# Patient Record
Sex: Female | Born: 1975 | Race: Black or African American | Hispanic: No | Marital: Single | Smoking: Current every day smoker
Health system: Southern US, Community
[De-identification: ages and names within clinical notes are randomized; demographics above are authoritative.]

## PROBLEM LIST (undated history)

## (undated) ENCOUNTER — Inpatient Hospital Stay (HOSPITAL_COMMUNITY): Payer: Self-pay

## (undated) DIAGNOSIS — A599 Trichomoniasis, unspecified: Secondary | ICD-10-CM

## (undated) DIAGNOSIS — N938 Other specified abnormal uterine and vaginal bleeding: Secondary | ICD-10-CM

## (undated) DIAGNOSIS — N879 Dysplasia of cervix uteri, unspecified: Secondary | ICD-10-CM

## (undated) DIAGNOSIS — N719 Inflammatory disease of uterus, unspecified: Secondary | ICD-10-CM

## (undated) DIAGNOSIS — N12 Tubulo-interstitial nephritis, not specified as acute or chronic: Secondary | ICD-10-CM

## (undated) DIAGNOSIS — F1920 Other psychoactive substance dependence, uncomplicated: Secondary | ICD-10-CM

## (undated) HISTORY — DX: Tubulo-interstitial nephritis, not specified as acute or chronic: N12

## (undated) HISTORY — DX: Other specified abnormal uterine and vaginal bleeding: N93.8

## (undated) HISTORY — DX: Dysplasia of cervix uteri, unspecified: N87.9

## (undated) HISTORY — DX: Other psychoactive substance dependence, uncomplicated: F19.20

## (undated) HISTORY — DX: Inflammatory disease of uterus, unspecified: N71.9

## (undated) HISTORY — DX: Trichomoniasis, unspecified: A59.9

## (undated) HISTORY — PX: WISDOM TOOTH EXTRACTION: SHX21

---

## 1998-02-14 ENCOUNTER — Emergency Department (HOSPITAL_COMMUNITY): Admission: EM | Admit: 1998-02-14 | Discharge: 1998-02-14 | Payer: Self-pay | Admitting: *Deleted

## 1999-01-10 ENCOUNTER — Emergency Department (HOSPITAL_COMMUNITY): Admission: EM | Admit: 1999-01-10 | Discharge: 1999-01-10 | Payer: Self-pay | Admitting: Emergency Medicine

## 1999-06-16 ENCOUNTER — Emergency Department (HOSPITAL_COMMUNITY): Admission: EM | Admit: 1999-06-16 | Discharge: 1999-06-16 | Payer: Self-pay | Admitting: Emergency Medicine

## 2000-01-13 ENCOUNTER — Emergency Department (HOSPITAL_COMMUNITY): Admission: EM | Admit: 2000-01-13 | Discharge: 2000-01-13 | Payer: Self-pay | Admitting: Emergency Medicine

## 2000-04-03 ENCOUNTER — Inpatient Hospital Stay (HOSPITAL_COMMUNITY): Admission: AD | Admit: 2000-04-03 | Discharge: 2000-04-03 | Payer: Self-pay | Admitting: *Deleted

## 2000-05-27 ENCOUNTER — Inpatient Hospital Stay (HOSPITAL_COMMUNITY): Admission: AD | Admit: 2000-05-27 | Discharge: 2000-05-27 | Payer: Self-pay | Admitting: *Deleted

## 2001-02-24 ENCOUNTER — Inpatient Hospital Stay (HOSPITAL_COMMUNITY): Admission: AD | Admit: 2001-02-24 | Discharge: 2001-02-24 | Payer: Self-pay | Admitting: *Deleted

## 2001-03-02 ENCOUNTER — Inpatient Hospital Stay (HOSPITAL_COMMUNITY): Admission: AD | Admit: 2001-03-02 | Discharge: 2001-03-02 | Payer: Self-pay | Admitting: Obstetrics & Gynecology

## 2001-07-19 ENCOUNTER — Inpatient Hospital Stay (HOSPITAL_COMMUNITY): Admission: AD | Admit: 2001-07-19 | Discharge: 2001-07-19 | Payer: Self-pay | Admitting: *Deleted

## 2001-10-24 ENCOUNTER — Inpatient Hospital Stay (HOSPITAL_COMMUNITY): Admission: AD | Admit: 2001-10-24 | Discharge: 2001-10-24 | Payer: Self-pay | Admitting: *Deleted

## 2002-05-24 ENCOUNTER — Encounter: Payer: Self-pay | Admitting: Emergency Medicine

## 2002-05-24 ENCOUNTER — Emergency Department (HOSPITAL_COMMUNITY): Admission: EM | Admit: 2002-05-24 | Discharge: 2002-05-24 | Payer: Self-pay | Admitting: Emergency Medicine

## 2002-06-20 ENCOUNTER — Inpatient Hospital Stay (HOSPITAL_COMMUNITY): Admission: AD | Admit: 2002-06-20 | Discharge: 2002-06-20 | Payer: Self-pay | Admitting: Obstetrics and Gynecology

## 2002-06-20 ENCOUNTER — Encounter: Payer: Self-pay | Admitting: Obstetrics and Gynecology

## 2002-07-04 ENCOUNTER — Inpatient Hospital Stay (HOSPITAL_COMMUNITY): Admission: AD | Admit: 2002-07-04 | Discharge: 2002-07-04 | Payer: Self-pay | Admitting: Obstetrics and Gynecology

## 2003-06-17 ENCOUNTER — Emergency Department (HOSPITAL_COMMUNITY): Admission: EM | Admit: 2003-06-17 | Discharge: 2003-06-18 | Payer: Self-pay | Admitting: Emergency Medicine

## 2004-08-06 ENCOUNTER — Emergency Department (HOSPITAL_COMMUNITY): Admission: EM | Admit: 2004-08-06 | Discharge: 2004-08-06 | Payer: Self-pay | Admitting: Emergency Medicine

## 2004-08-07 ENCOUNTER — Emergency Department (HOSPITAL_COMMUNITY): Admission: EM | Admit: 2004-08-07 | Discharge: 2004-08-07 | Payer: Self-pay | Admitting: Emergency Medicine

## 2004-11-12 ENCOUNTER — Inpatient Hospital Stay (HOSPITAL_COMMUNITY): Admission: AD | Admit: 2004-11-12 | Discharge: 2004-11-12 | Payer: Self-pay | Admitting: Obstetrics & Gynecology

## 2004-11-24 ENCOUNTER — Encounter (INDEPENDENT_AMBULATORY_CARE_PROVIDER_SITE_OTHER): Payer: Self-pay | Admitting: *Deleted

## 2004-11-24 ENCOUNTER — Ambulatory Visit: Payer: Self-pay | Admitting: *Deleted

## 2005-03-02 ENCOUNTER — Ambulatory Visit: Payer: Self-pay | Admitting: *Deleted

## 2005-05-06 ENCOUNTER — Inpatient Hospital Stay (HOSPITAL_COMMUNITY): Admission: AD | Admit: 2005-05-06 | Discharge: 2005-05-06 | Payer: Self-pay | Admitting: *Deleted

## 2005-05-31 HISTORY — PX: DILATION AND CURETTAGE OF UTERUS: SHX78

## 2005-06-22 ENCOUNTER — Emergency Department (HOSPITAL_COMMUNITY): Admission: EM | Admit: 2005-06-22 | Discharge: 2005-06-22 | Payer: Self-pay | Admitting: Emergency Medicine

## 2005-06-24 ENCOUNTER — Ambulatory Visit: Payer: Self-pay | Admitting: Family Medicine

## 2005-12-29 ENCOUNTER — Emergency Department (HOSPITAL_COMMUNITY): Admission: EM | Admit: 2005-12-29 | Discharge: 2005-12-29 | Payer: Self-pay | Admitting: Family Medicine

## 2005-12-29 ENCOUNTER — Emergency Department (HOSPITAL_COMMUNITY): Admission: EM | Admit: 2005-12-29 | Discharge: 2005-12-29 | Payer: Self-pay | Admitting: Emergency Medicine

## 2005-12-29 ENCOUNTER — Emergency Department (HOSPITAL_COMMUNITY): Admission: EM | Admit: 2005-12-29 | Discharge: 2005-12-30 | Payer: Self-pay | Admitting: Emergency Medicine

## 2006-04-25 ENCOUNTER — Emergency Department (HOSPITAL_COMMUNITY): Admission: EM | Admit: 2006-04-25 | Discharge: 2006-04-25 | Payer: Self-pay | Admitting: Emergency Medicine

## 2006-06-23 IMAGING — US US TRANSVAGINAL NON-OB
1 series · 18 of 25 positions shown · non-contrast
Comparison: none

CLINICAL DATA: Abdominal pain.  LMP 10/07/04.  Vaginal bleeding.
TRANSABDOMINAL AND ENDOVAGINAL PELVIC ULTRASOUND:
TECHNIQUE: Multiple images of the uterus and adnexa were obtained using a transabdominal and endovaginal approaches. 
The uterus has a sagittal length of 8.0 cm, AP width of 4.5 cm and a transverse width of 5.1 cm.  A homogeneous uterine myometrium is seen.  The endometrial canal demonstrates an area of questionable focal thickening in the upper uterine segment portion of the canal.  Color Doppler evaluation of this area which measures .6 cm in AP width demonstrates some internal flow and could represent a focal polyp.  As the patient is in a presecretory phase of the cycle, initial reassessment in the post-secretory phase of the cycle would be recommended to see if this finding persists after menses.  If it does, sonohysterography would be recommended for complete evaluation.  
Both ovaries are seen and have a normal appearance with the right ovary measuring 3.0 x 1.9 x 2.1 cm and the left ovary measuring 3.1 x 1.4 x 2.4 cm. A small simple left paraovarian cyst is identified measuring 1.1 x 0.7 x 0.9 cm.  No cul-de-sac or periovarian fluid is seen and no separate adnexal masses are otherwise noted.

[Series 1: us transvaginal non-ob · 18 of 49 slices shown]
[im 1/49]
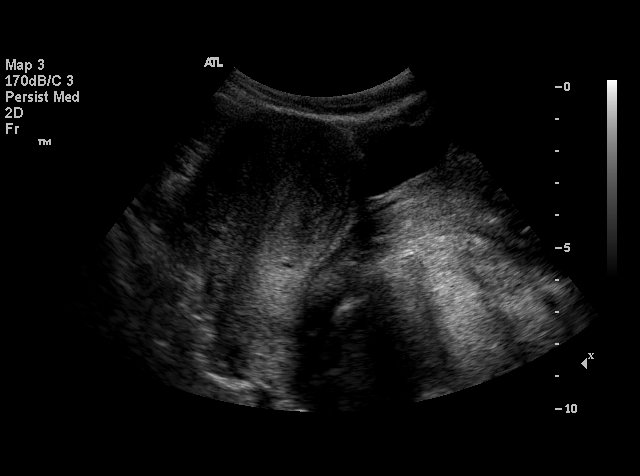
[im 5/49]
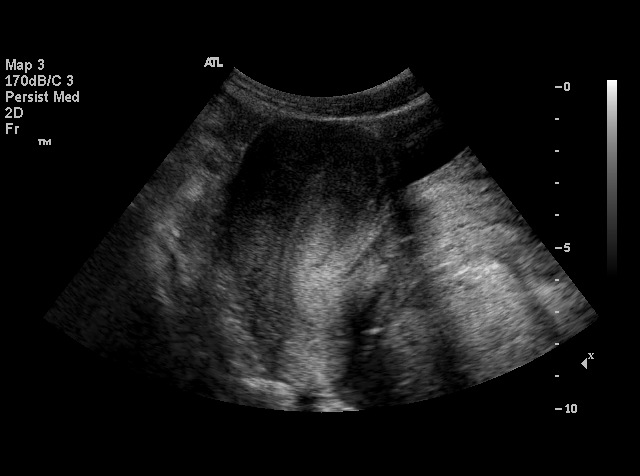
[im 7/49]
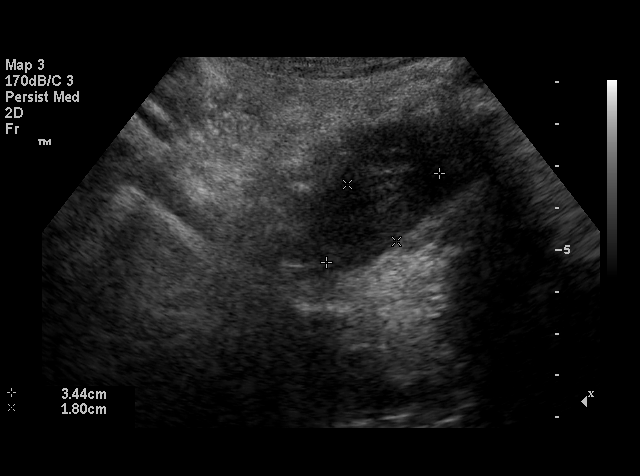
[im 9/49]
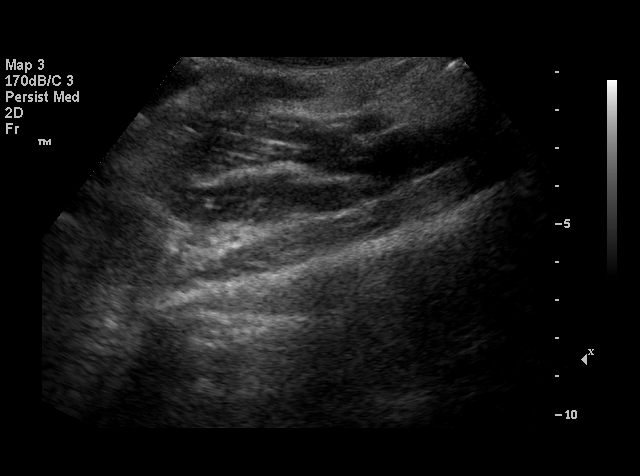
[im 13/49]
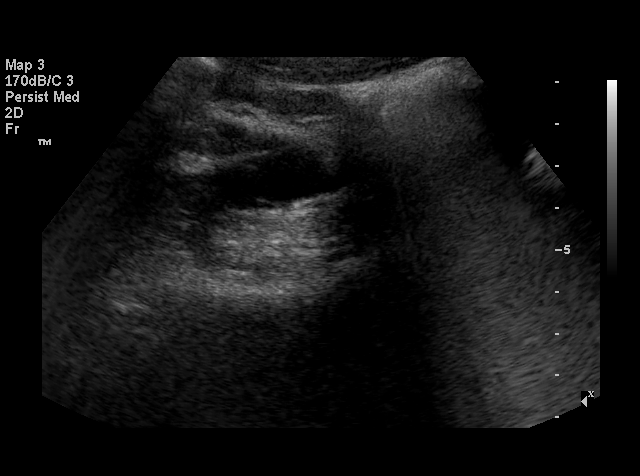
[im 15/49]
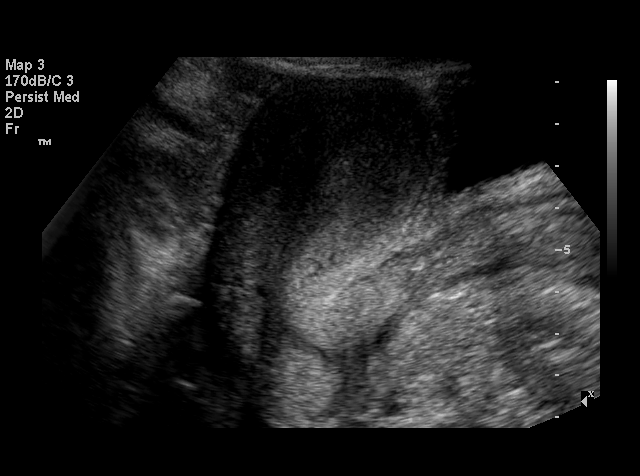
[im 19/49]
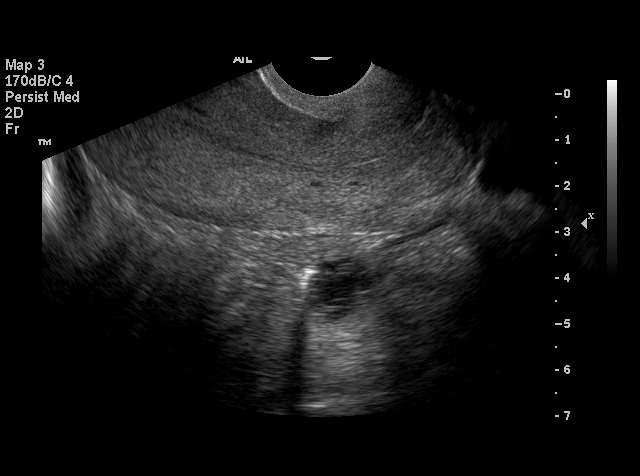
[im 21/49]
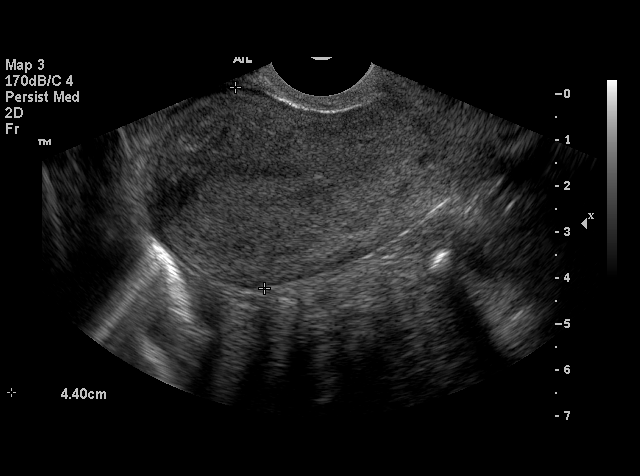
[im 23/49]
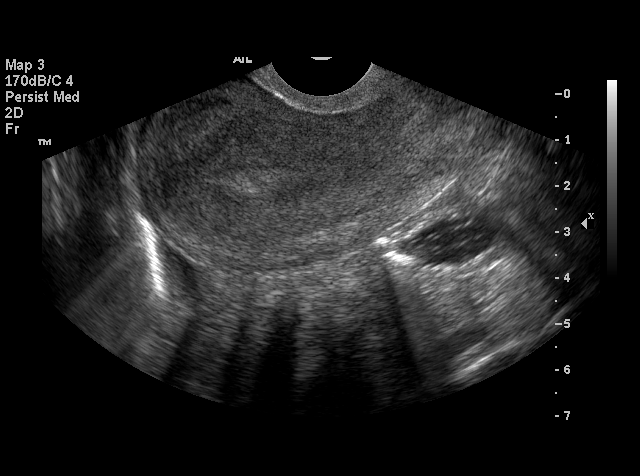
[im 27/49]
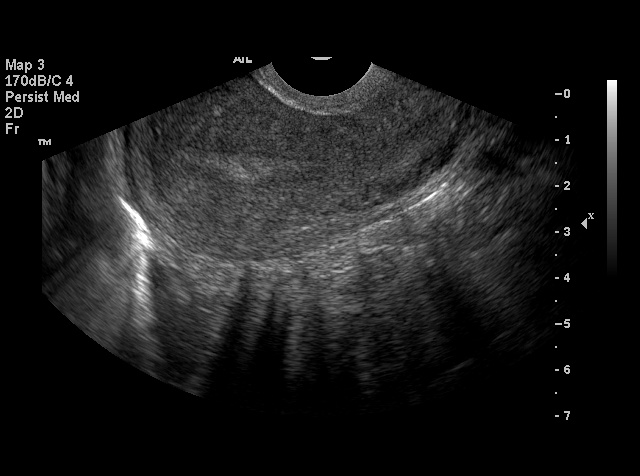
[im 29/49]
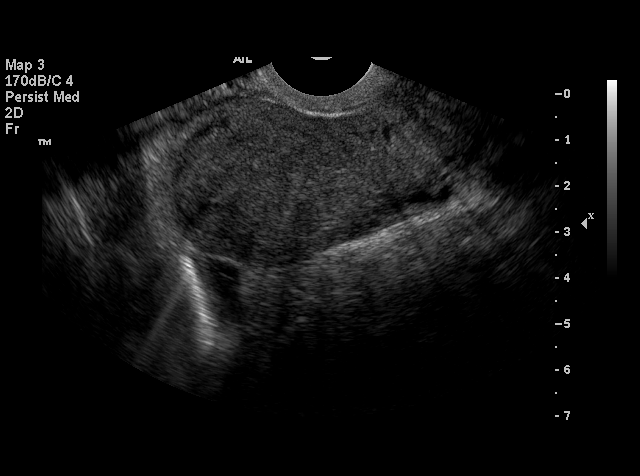
[im 31/49]
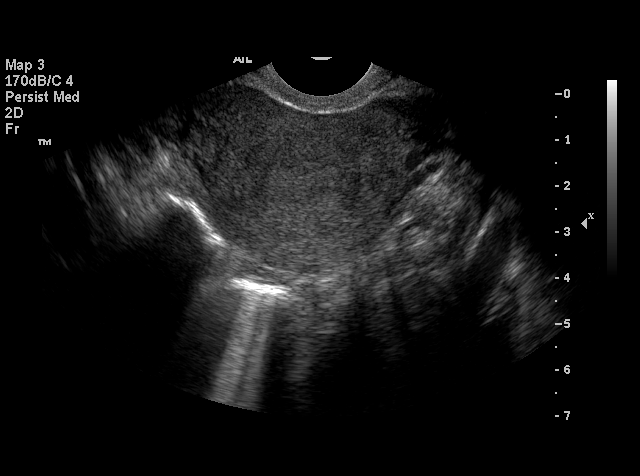
[im 35/49]
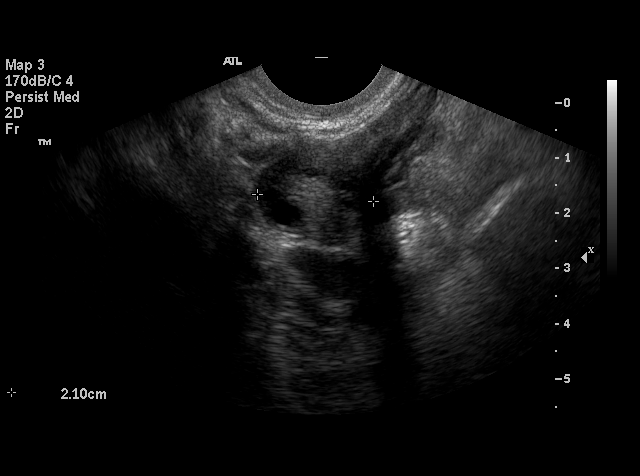
[im 37/49]
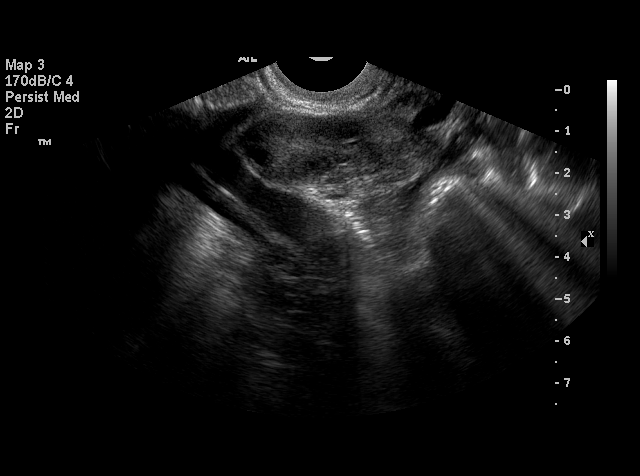
[im 41/49]
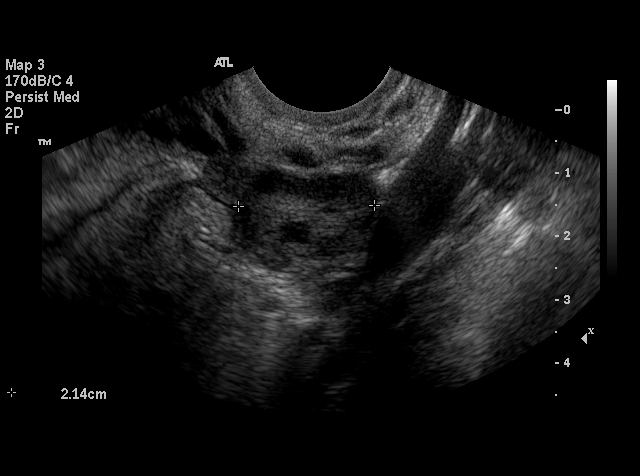
[im 43/49]
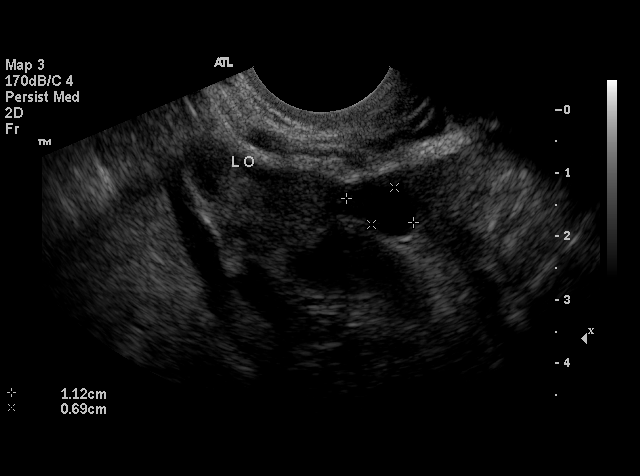
[im 45/49]
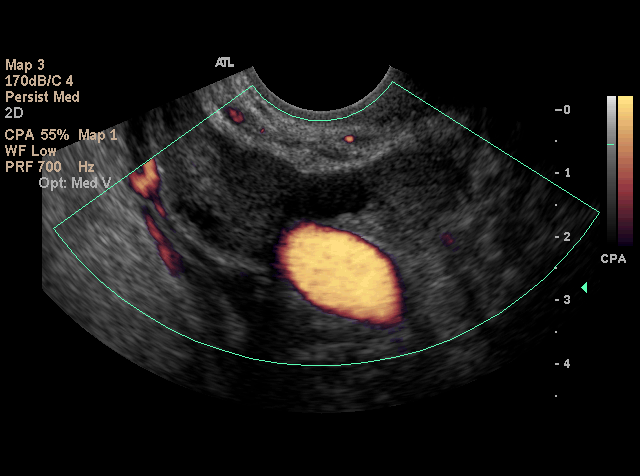
[im 49/49]
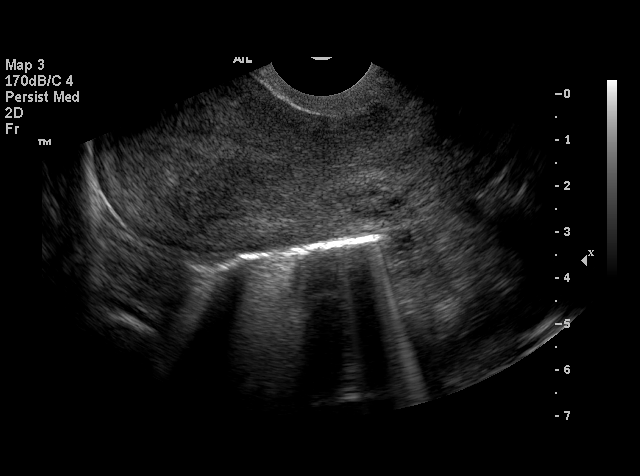

[18 of 25 positions shown; findings below may reference images not displayed]

IMPRESSION: 1.  Normal uterine myometrium and ovaries.
2.  Small left paraovarian cyst.
3.  Question small area of focal thickening in the endometrial canal which could represent a polyp or may simply be a result of the patient?s presecretory phase of the cycle.  Follow-up in the post-secretory phase of the cycle would be recommended for initial reevaluation.

## 2006-10-05 ENCOUNTER — Ambulatory Visit: Payer: Self-pay | Admitting: Obstetrics & Gynecology

## 2006-10-05 ENCOUNTER — Encounter: Payer: Self-pay | Admitting: Obstetrics & Gynecology

## 2007-03-21 ENCOUNTER — Inpatient Hospital Stay (HOSPITAL_COMMUNITY): Admission: AD | Admit: 2007-03-21 | Discharge: 2007-03-21 | Payer: Self-pay | Admitting: Family Medicine

## 2007-03-30 ENCOUNTER — Ambulatory Visit: Payer: Self-pay | Admitting: Obstetrics & Gynecology

## 2007-04-15 ENCOUNTER — Emergency Department (HOSPITAL_COMMUNITY): Admission: EM | Admit: 2007-04-15 | Discharge: 2007-04-15 | Payer: Self-pay | Admitting: Emergency Medicine

## 2007-05-09 ENCOUNTER — Ambulatory Visit (HOSPITAL_COMMUNITY): Admission: RE | Admit: 2007-05-09 | Discharge: 2007-05-09 | Payer: Self-pay | Admitting: Obstetrics & Gynecology

## 2007-05-09 ENCOUNTER — Encounter: Payer: Self-pay | Admitting: Obstetrics & Gynecology

## 2007-05-09 ENCOUNTER — Ambulatory Visit: Payer: Self-pay | Admitting: Obstetrics & Gynecology

## 2007-06-29 ENCOUNTER — Ambulatory Visit: Payer: Self-pay | Admitting: Obstetrics and Gynecology

## 2007-11-29 ENCOUNTER — Encounter (INDEPENDENT_AMBULATORY_CARE_PROVIDER_SITE_OTHER): Payer: Self-pay | Admitting: Gynecology

## 2007-11-29 ENCOUNTER — Ambulatory Visit: Payer: Self-pay | Admitting: Gynecology

## 2007-12-11 ENCOUNTER — Ambulatory Visit: Payer: Self-pay | Admitting: Gynecology

## 2008-06-06 ENCOUNTER — Ambulatory Visit: Payer: Self-pay | Admitting: Obstetrics and Gynecology

## 2008-06-07 ENCOUNTER — Encounter (INDEPENDENT_AMBULATORY_CARE_PROVIDER_SITE_OTHER): Payer: Self-pay | Admitting: Gynecology

## 2008-06-07 LAB — CONVERTED CEMR LAB: Yeast Wet Prep HPF POC: NONE SEEN

## 2008-08-27 ENCOUNTER — Ambulatory Visit: Payer: Self-pay | Admitting: Obstetrics & Gynecology

## 2008-10-17 ENCOUNTER — Emergency Department (HOSPITAL_COMMUNITY): Admission: EM | Admit: 2008-10-17 | Discharge: 2008-10-17 | Payer: Self-pay | Admitting: Emergency Medicine

## 2008-10-29 IMAGING — US US TRANSVAGINAL NON-OB
1 series · 14 of 25 positions shown · non-contrast
Comparison: none

HISTORY: Increased pain and bleeding

[Series 1: us transvaginal non-ob · 0.23mm/px · 47 acquisitions, 14 frames shown]
[im 1/47]
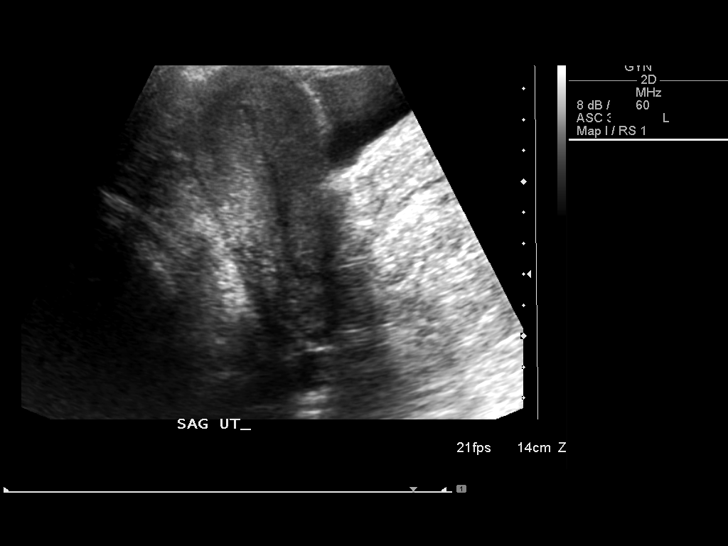
[im 4/47]
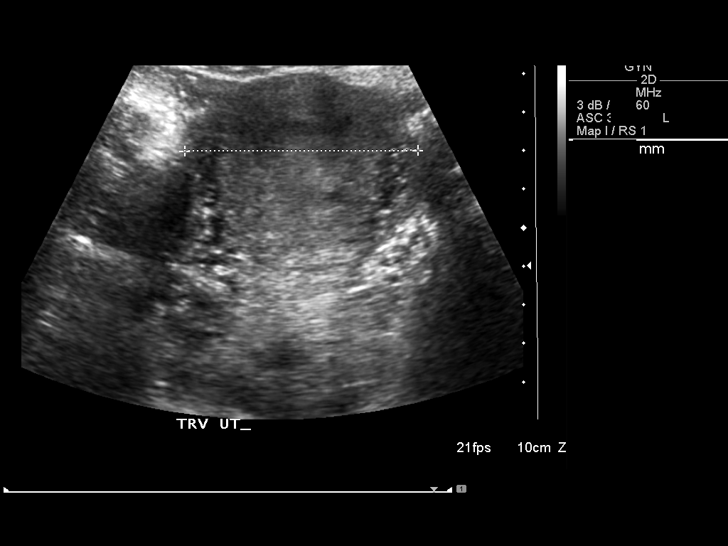
[im 8/47]
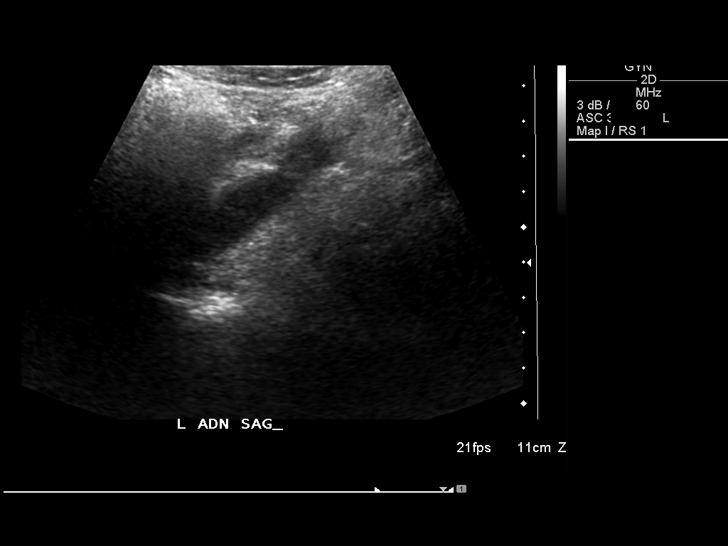
[im 12/47]
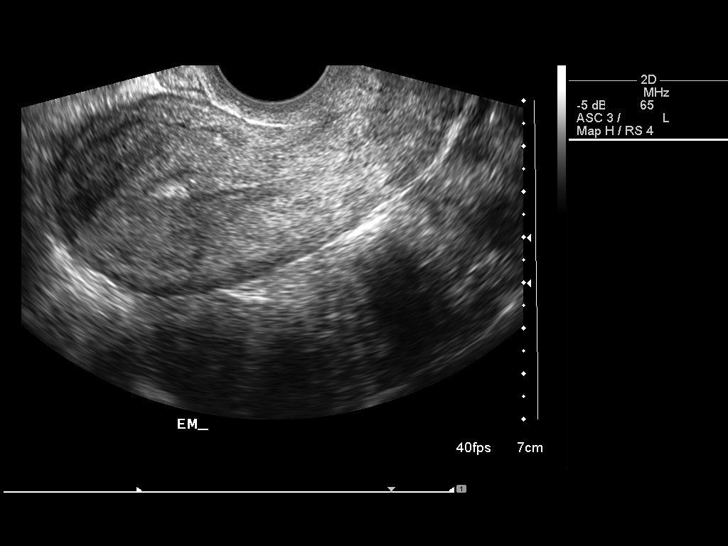
[im 16/47]
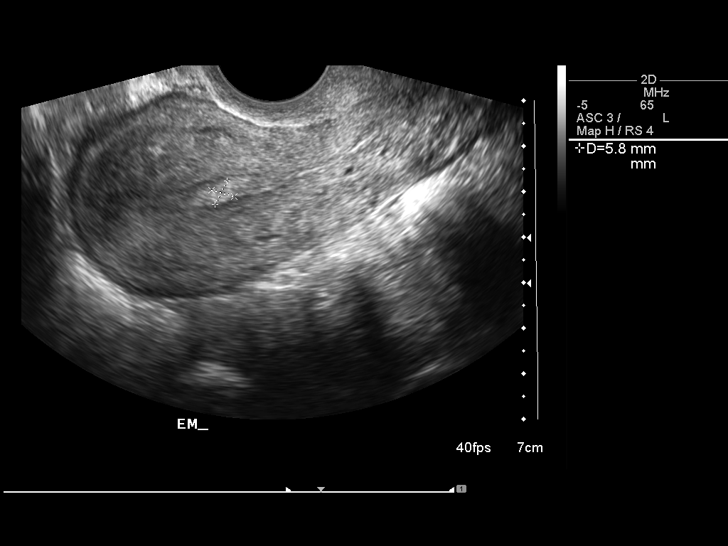
[im 18/47]
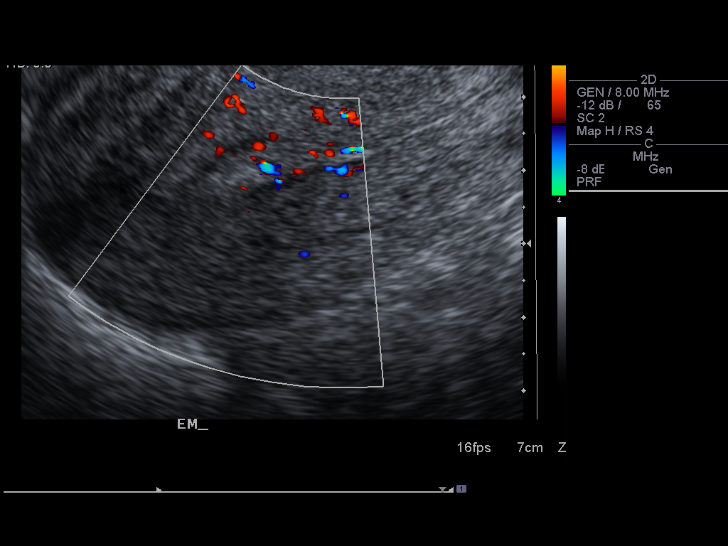
[im 22/47]
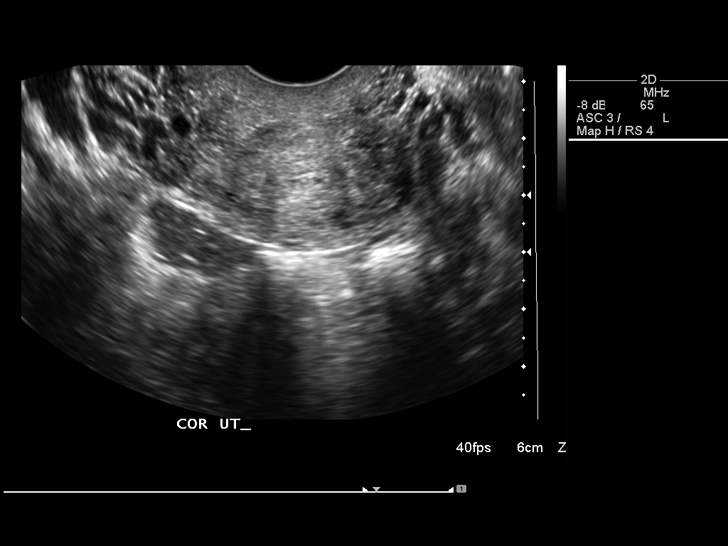
[im 25/47]
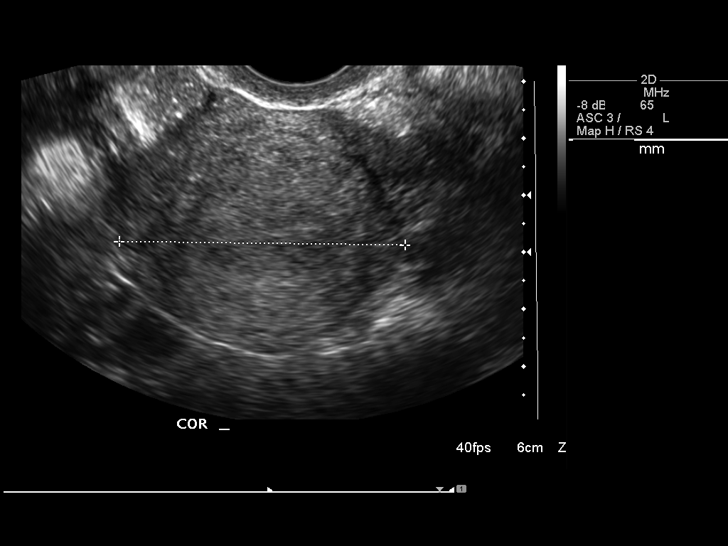
[im 29/47]
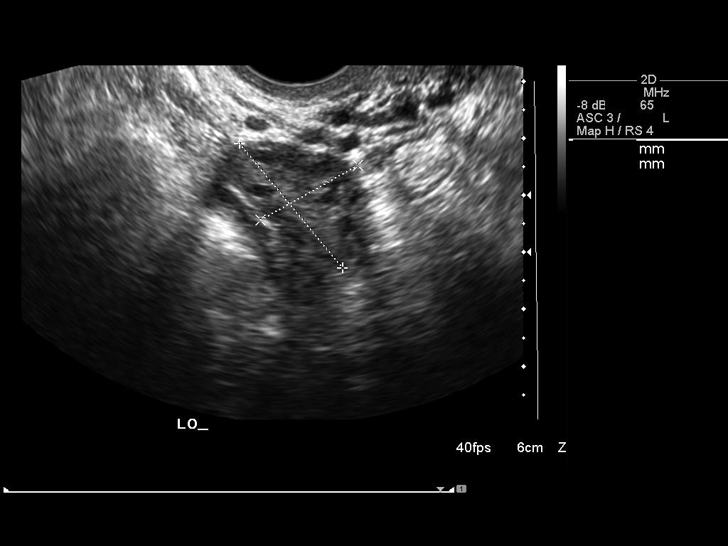
[im 31/47]
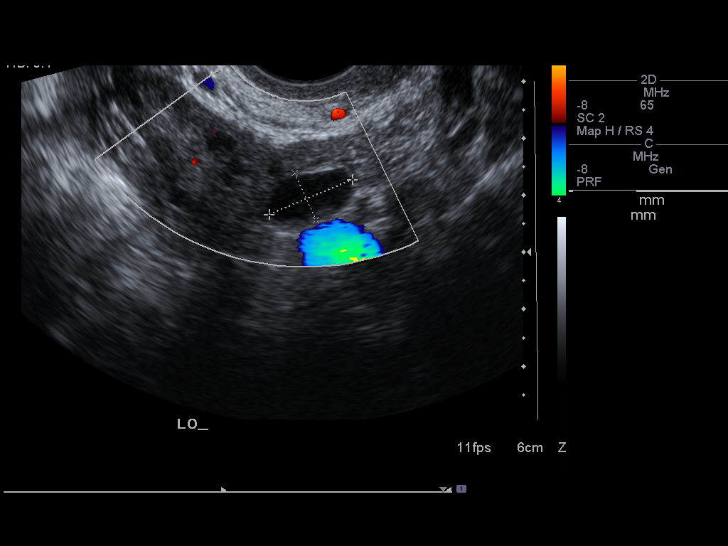
[im 35/47]
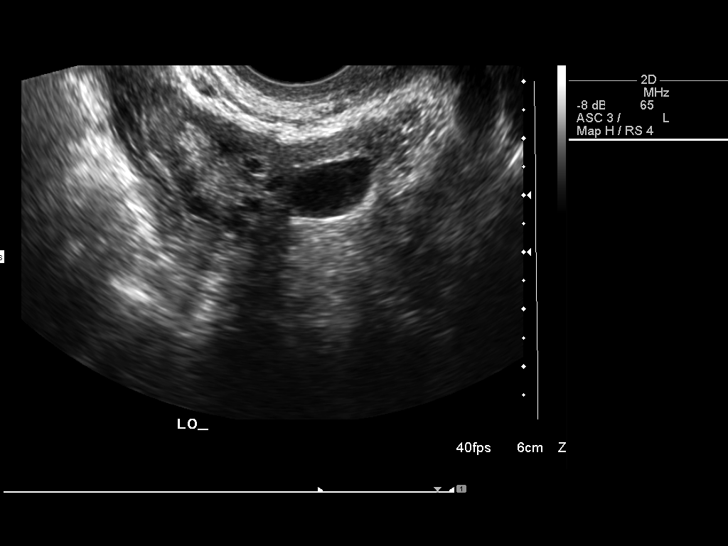
[im 39/47]
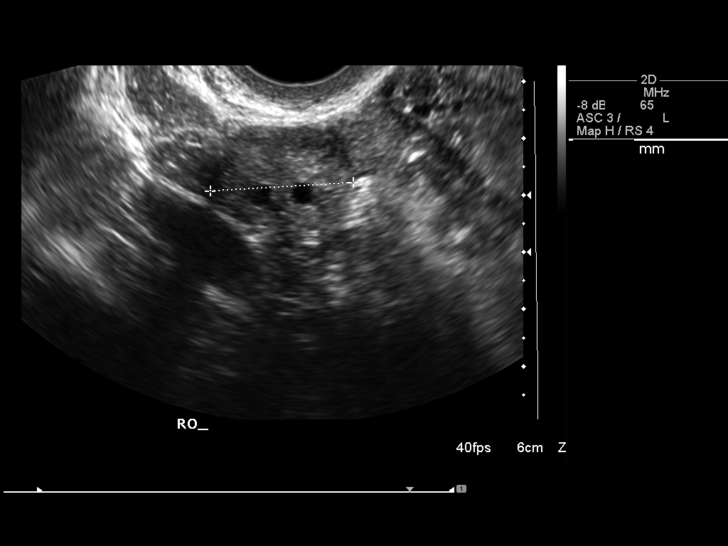
[im 43/47]
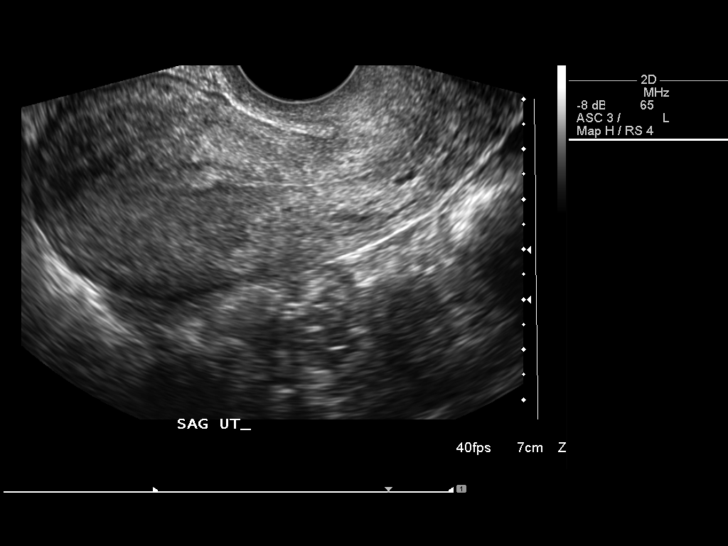
[im 47/47]
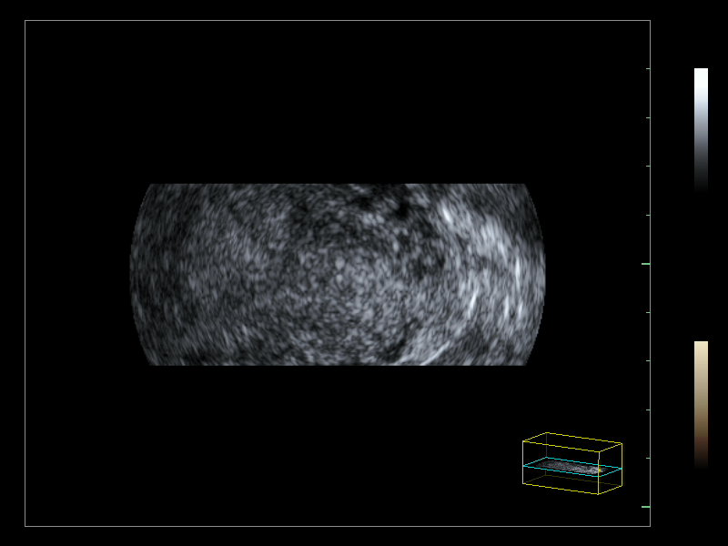

[14 of 25 positions shown; findings below may reference images not displayed]

ULTRASOUND PELVIS TRANSABDOMINAL COMPLETE MODIFIED:
ULTRASOUND PELVIS TRANSVAGINAL NON-OB:

Transabdominal and endovaginal sonography of pelvis performed.
Uterus, ovaries, adnexal regions, and pelvic cul-de-sac assessed.

Uterus measures 9.5 cm length x 4.3 cm AP x 5.0 cm transverse.
Endometrial complex measures up to 6 mm diameter at mid uterus.
Tiny hypervascular focus is noted at the endometrial complex at the mid to upper
uterus, 6 x 5 x 6 mm diameter, cannot exclude tiny polyp.
Remainder of uterus demonstrates normal morphology.
Right ovary normal size and morphology, 3.1 x 1.2 0.5 cm.
Left ovary measures 2.8 x 2.0 x 1.6 cm.
Small cyst is seen adjacent to left ovary, 16 x 9 x 7 mm, question ovarian or
paraovarian.
No other adnexal masses.
IMPRESSION: 16mm diameter cyst adjacent to left ovary, question exophytic ovarian versus
paraovarian origin.
6 mm diameter hypervascular focus within endometrial complex upper to mid
uterus, may represent focal thickening of endometrial complex but polyp in tumor
not excluded, consider either tissue diagnosis or followup sonography to
determine significance.

## 2008-11-13 ENCOUNTER — Ambulatory Visit: Payer: Self-pay | Admitting: Obstetrics & Gynecology

## 2009-02-06 ENCOUNTER — Encounter: Payer: Self-pay | Admitting: Obstetrics and Gynecology

## 2009-02-06 ENCOUNTER — Ambulatory Visit: Payer: Self-pay | Admitting: Obstetrics and Gynecology

## 2009-02-07 ENCOUNTER — Encounter: Payer: Self-pay | Admitting: Obstetrics and Gynecology

## 2009-03-24 ENCOUNTER — Emergency Department (HOSPITAL_COMMUNITY): Admission: EM | Admit: 2009-03-24 | Discharge: 2009-03-24 | Payer: Self-pay | Admitting: Emergency Medicine

## 2009-05-01 ENCOUNTER — Ambulatory Visit: Payer: Self-pay | Admitting: Obstetrics & Gynecology

## 2009-06-24 ENCOUNTER — Inpatient Hospital Stay (HOSPITAL_COMMUNITY): Admission: AD | Admit: 2009-06-24 | Discharge: 2009-06-24 | Payer: Self-pay | Admitting: Obstetrics & Gynecology

## 2009-11-05 ENCOUNTER — Ambulatory Visit: Payer: Self-pay | Admitting: Obstetrics and Gynecology

## 2010-05-14 ENCOUNTER — Ambulatory Visit: Payer: Self-pay | Admitting: Obstetrics and Gynecology

## 2010-05-14 ENCOUNTER — Encounter (INDEPENDENT_AMBULATORY_CARE_PROVIDER_SITE_OTHER): Payer: Self-pay | Admitting: *Deleted

## 2010-05-14 LAB — CONVERTED CEMR LAB
HCV Ab: NEGATIVE
HIV-1 antibody: NEGATIVE
HIV-2 Ab: NEGATIVE
HIV: REACTIVE
Hepatitis B Surface Ag: NEGATIVE

## 2010-06-21 ENCOUNTER — Encounter: Payer: Self-pay | Admitting: *Deleted

## 2010-08-10 LAB — POCT PREGNANCY, URINE: Preg Test, Ur: NEGATIVE

## 2010-08-13 ENCOUNTER — Ambulatory Visit: Payer: Self-pay

## 2010-08-16 LAB — POCT PREGNANCY, URINE: Preg Test, Ur: NEGATIVE

## 2010-08-16 LAB — URINALYSIS, ROUTINE W REFLEX MICROSCOPIC
Bilirubin Urine: NEGATIVE
Ketones, ur: NEGATIVE mg/dL
pH: 6 (ref 5.0–8.0)

## 2010-08-16 LAB — CBC
HCT: 37.5 % (ref 36.0–46.0)
Hemoglobin: 12.8 g/dL (ref 12.0–15.0)
MCHC: 34.2 g/dL (ref 30.0–36.0)
MCV: 101.7 fL — ABNORMAL HIGH (ref 78.0–100.0)
RBC: 3.68 MIL/uL — ABNORMAL LOW (ref 3.87–5.11)
RDW: 13.2 % (ref 11.5–15.5)
WBC: 7.1 10*3/uL (ref 4.0–10.5)

## 2010-08-16 LAB — DIFFERENTIAL
Basophils Absolute: 0 10*3/uL (ref 0.0–0.1)
Basophils Relative: 0 % (ref 0–1)
Eosinophils Absolute: 0.1 10*3/uL (ref 0.0–0.7)
Lymphs Abs: 2.2 10*3/uL (ref 0.7–4.0)
Neutro Abs: 4.5 10*3/uL (ref 1.7–7.7)
Neutrophils Relative %: 61 % (ref 43–77)

## 2010-08-16 LAB — URINE CULTURE: Colony Count: >=100000

## 2010-08-16 LAB — WET PREP, GENITAL

## 2010-08-16 LAB — GC/CHLAMYDIA PROBE AMP, GENITAL: Chlamydia, DNA Probe: NEGATIVE

## 2010-08-19 ENCOUNTER — Ambulatory Visit (INDEPENDENT_AMBULATORY_CARE_PROVIDER_SITE_OTHER): Payer: Medicaid Other

## 2010-08-19 DIAGNOSIS — N949 Unspecified condition associated with female genital organs and menstrual cycle: Secondary | ICD-10-CM

## 2010-09-03 LAB — POCT URINALYSIS DIP (DEVICE)
Bilirubin Urine: NEGATIVE
Glucose, UA: NEGATIVE mg/dL
Ketones, ur: NEGATIVE mg/dL
Nitrite: POSITIVE — AB
Protein, ur: NEGATIVE mg/dL
Specific Gravity, Urine: 1.015 (ref 1.005–1.030)
Urobilinogen, UA: 1 mg/dL (ref 0.0–1.0)
pH: 6.5 (ref 5.0–8.0)

## 2010-09-03 LAB — POCT PREGNANCY, URINE: Preg Test, Ur: NEGATIVE

## 2010-09-08 LAB — POCT RAPID STREP A (OFFICE): Streptococcus, Group A Screen (Direct): POSITIVE — AB

## 2010-09-14 LAB — POCT PREGNANCY, URINE: Preg Test, Ur: NEGATIVE

## 2010-09-22 ENCOUNTER — Inpatient Hospital Stay (INDEPENDENT_AMBULATORY_CARE_PROVIDER_SITE_OTHER)
Admission: RE | Admit: 2010-09-22 | Discharge: 2010-09-22 | Disposition: A | Discharge status: Home / Self Care | Payer: Medicaid Other | Source: Ambulatory Visit | Attending: Family Medicine | Admitting: Family Medicine

## 2010-09-22 DIAGNOSIS — J069 Acute upper respiratory infection, unspecified: Secondary | ICD-10-CM

## 2010-09-22 DIAGNOSIS — N898 Other specified noninflammatory disorders of vagina: Secondary | ICD-10-CM

## 2010-09-22 LAB — POCT URINALYSIS DIP (DEVICE)
Glucose, UA: NEGATIVE mg/dL
Protein, ur: 30 mg/dL — AB
Specific Gravity, Urine: 1.02 (ref 1.005–1.030)
pH: 7 (ref 5.0–8.0)

## 2010-09-22 LAB — WET PREP, GENITAL

## 2010-10-13 NOTE — Op Note (Signed)
Cindy Hernandez, Cindy Hernandez               ACCOUNT NO.:  [id number]   MEDICAL RECORD NO.:  [id number]          PATIENT TYPE:  AMB   LOCATION:  [facility]                           [facility]ACILITY:  [facility]   PHYSICIAN:  Allie Bossier, MD        DATE OF BIRTH:  [birth year]   DATE OF PROCEDURE:  DATE OF DISCHARGE:                               OPERATIVE REPORT   PREOPERATIVE DIAGNOSES:  Dysfunctional uterine bleeding (code 626.8).   POSTOPERATIVE DIAGNOSES:  Dysfunctional uterine bleeding (code 626.8).   PROCEDURE:  D&C.   SURGEON:  Allie Bossier, MD.   ANESTHESIA:  MAC and local.   COMPLICATIONS:  None.   ESTIMATED BLOOD LOSS:  Minimal.   SPECIMENS:  Uterine curetting.   FINDINGS:  Large amount of polypoid tissue from the uterus.   DESCRIPTION OF PROCEDURE:  The risks, benefits, and alternatives of  surgery were explained, understood and accepted, consents were signed.  She was taken to the operating room, placed in the dorsal lithotomy  position, general anesthesia was performed without complications. Her  vagina was prepped and draped in the usual sterile fashion. A bimanual  exam revealed a normal size and shape anteverted uterus and nonenlarged  adnexa. A weighted speculum was placed posteriorly, a Deaver anteriorly.  The anterior lip of the cervix was grasped with a single tooth  tenaculum. A total of 10 mL of 1% lidocaine was used for a cervical  block. The stenotic cervix was gently dilated to accommodate a sharp  curette. The sharp curette was used to empty the uterus of all contents.  The curettage was done until a gritty sensation was appreciated  throughout all quadrants in the fundus of the uterus. A large amount of  polypoid tissue was obtained. The tenaculum was removed, no bleeding was  noted, she was taken to the recovery room in stable condition with the  instrument, sponge and needle counts correct.      Allie Bossier, MD  Electronically Signed     MCD/MEDQ  D:  05/09/2007  T:   05/09/2007  Job:  [id number]

## 2010-10-13 NOTE — Group Therapy Note (Signed)
Cindy Hernandez, WATTON NO.:  [id number]   MEDICAL RECORD NO.:  [id number]          PATIENT TYPE:  WOC   LOCATION:  [facility]                   FACILITY:  [facility]   PHYSICIAN:  Ginger Carne, MD DATE OF BIRTH:  18-Jun-1975   DATE OF SERVICE:                                  CLINIC NOTE   NARRATIVE:  The patient presents today with a 2-week history of foul-  smelling fishy odor creamy, yellow discharge.  Her last Pap smear was in  May of 2008.   SALIENT PHYSICAL FINDINGS:  PELVIC:  External genitalia:  Vulva and  vagina demonstrate a yellow creamy discharge.  Cervix is smooth without  erosions or lesions.  Uterus small, anteverted, flexed.  Both adnexa  palpable found to be nontender and normal.   PLAN:  The patient had a wet prep, gonorrhea and chlamydia probe  performed as well as a Pap smear.  She will be treated empirically with  metronidazole 500 mg, 1 twice a day for 7 days with 1 refill.  Symptomatology is consistent with either Trichomonas and/or bacterial  vaginosis.           ______________________________  Ginger Carne, MD     SHB/MEDQ  D:  11/29/2007  T:  11/29/2007  Job:  [id number]

## 2010-10-16 NOTE — Group Therapy Note (Signed)
Cindy Hernandez, Cindy Hernandez NO.:  [id number]   MEDICAL RECORD NO.:  [id number]          PATIENT TYPE:  WOC   LOCATION:  [facility]                   FACILITY:  [facility]   PHYSICIAN:  Argentina Donovan, MD        DATE OF BIRTH:  07-27-1975   DATE OF SERVICE:                                    CLINIC NOTE   CHIEF COMPLAINT:  Blood in the urine.   SUBJECTIVE:  The patient is a 35 year old African-American female.  Recently, she was started on Depo.  Her last shot was on March 02, 2005.  She was due for her next Depo injection at the latest on June 01, 2005.  The patient has missed and is coming in today requesting the Depo and  evaluation for the bleeding.   The patient states that her bleeding only when she pees and she denies any  vaginal bleeding.  She denies any blood on the toilet paper after  defecation.  She denies any pain with defecation.  She denies any pain with  urination.  She has no back or abdominal pain consistent with a kidney  stone.   With regards to her Depo shot, she has a urine pregnancy test that is  negative today in the clinic.  However, she has had unprotected intercourse  in the last two weeks.   PHYSICAL EXAMINATION:  VITAL SIGNS:  Pulse is 99, blood pressure 108/77,  weight is 100.5 pounds and height is 5 foot 3 inches.  GENERAL:  In no apparent distress.  Alert and oriented x3.  ABDOMEN EXAM:  Soft, nontender and nondistended.  Positive bowel sounds.  GENITOURINARY EXAM:  The patient has normal appearing external genitalia.  There are no tears or lesions proximal to the urethra that could account for  blood in the urine.  She does have frank blood around her labia.  RECTAL EXAM:  No hemorrhoids or anal fissures.  Internal speculum exam shows  copious blood in the posterior fornix with frank blood coming from the os.  She has no cervical motion tenderness, no adnexal tenderness and no palpable  uterine masses.   A wet prep is pending at the  time of the dictation, GC/Chlamydia is pending  at the time of dictation, and a urinalysis is pending at the time of the  dictation.   ASSESSMENT AND PLAN:  A 35 year old African-American female with vaginal  bleeding.  1.  Vaginal bleeding.  The patient has a negative pregnancy test today and      has been on Depo theoretically under its protection for the three      months.  The likely source of her vaginal bleeding could possibly be      dysfunctional bleeding secondary to the Depo injections.  We will check      GC/Chlamydia and we will send a wet prep, and we will check a urinalysis      for any signs of infection, but I believe more than likely she is having      spotting secondary to the Depo.  2.  Birth control.  Unable to give  the Depo injection today even though she      has a negative pregnancy test today.  She has had unprotected      intercourse in the last two weeks.  Advised the patient to have      protected sex for the next two weeks and come back in two weeks, and at      that time we will repeat a pregnancy test and if negative, we will      administer the Depo injection.  At that time, we can also follow up the      results of her lab work.  Anticipate that the vaginal bleeding will      improve once the patient has adjusted to her Depo treatment.   DICTATED BY:  Dr. Tanya Nones           ______________________________  Argentina Donovan, MD     PR/MEDQ  D:  06/24/2005  T:  06/24/2005  Job:  [id number]

## 2010-11-04 ENCOUNTER — Ambulatory Visit (INDEPENDENT_AMBULATORY_CARE_PROVIDER_SITE_OTHER): Payer: Medicaid Other

## 2010-11-04 DIAGNOSIS — Z3049 Encounter for surveillance of other contraceptives: Secondary | ICD-10-CM

## 2011-01-20 ENCOUNTER — Ambulatory Visit (INDEPENDENT_AMBULATORY_CARE_PROVIDER_SITE_OTHER): Payer: Medicaid Other | Admitting: *Deleted

## 2011-01-20 VITALS — BP 115/75 | HR 84 | Ht 63.0 in | Wt 107.7 lb

## 2011-01-20 DIAGNOSIS — Z3049 Encounter for surveillance of other contraceptives: Secondary | ICD-10-CM

## 2011-01-20 MED ORDER — MEDROXYPROGESTERONE ACETATE 150 MG/ML IM SUSP
150.0000 mg | INTRAMUSCULAR | Status: AC
  Administered 2011-01-20 – 2011-04-07 (×2): 150 mg via INTRAMUSCULAR

## 2011-03-08 LAB — CBC
MCHC: 34.6
MCV: 101 — ABNORMAL HIGH
Platelets: 228
RBC: 3.54 — ABNORMAL LOW
RDW: 13.1

## 2011-03-08 LAB — HCG, SERUM, QUALITATIVE: Preg, Serum: NEGATIVE

## 2011-03-10 LAB — URINALYSIS, ROUTINE W REFLEX MICROSCOPIC
Bilirubin Urine: NEGATIVE
Ketones, ur: NEGATIVE
Leukocytes, UA: NEGATIVE
Nitrite: POSITIVE — AB
Protein, ur: NEGATIVE
Urobilinogen, UA: 0.2
pH: 5.5

## 2011-03-10 LAB — CBC
HCT: 34.5 — ABNORMAL LOW
MCHC: 34.9
MCV: 101.6 — ABNORMAL HIGH
Platelets: 245
WBC: 5.7

## 2011-03-10 LAB — POCT PREGNANCY, URINE
Operator id: 113551
Preg Test, Ur: NEGATIVE

## 2011-03-10 LAB — WET PREP, GENITAL: Yeast Wet Prep HPF POC: NONE SEEN

## 2011-03-10 LAB — URINE MICROSCOPIC-ADD ON

## 2011-04-07 ENCOUNTER — Ambulatory Visit: Payer: Medicaid Other

## 2011-04-07 ENCOUNTER — Ambulatory Visit (INDEPENDENT_AMBULATORY_CARE_PROVIDER_SITE_OTHER): Payer: Medicaid Other | Admitting: *Deleted

## 2011-04-07 VITALS — BP 101/71 | HR 75 | Temp 97.1°F

## 2011-04-07 DIAGNOSIS — Z3049 Encounter for surveillance of other contraceptives: Secondary | ICD-10-CM

## 2011-04-07 DIAGNOSIS — Z30019 Encounter for initial prescription of contraceptives, unspecified: Secondary | ICD-10-CM

## 2011-04-07 DIAGNOSIS — Z308 Encounter for other contraceptive management: Secondary | ICD-10-CM

## 2011-04-14 ENCOUNTER — Encounter: Payer: Self-pay | Admitting: *Deleted

## 2011-04-14 DIAGNOSIS — N925 Other specified irregular menstruation: Secondary | ICD-10-CM

## 2011-04-14 DIAGNOSIS — N938 Other specified abnormal uterine and vaginal bleeding: Secondary | ICD-10-CM | POA: Insufficient documentation

## 2011-04-14 DIAGNOSIS — N949 Unspecified condition associated with female genital organs and menstrual cycle: Secondary | ICD-10-CM

## 2011-04-14 DIAGNOSIS — Z7251 High risk heterosexual behavior: Secondary | ICD-10-CM

## 2011-04-14 DIAGNOSIS — Z87891 Personal history of nicotine dependence: Secondary | ICD-10-CM

## 2011-05-05 ENCOUNTER — Encounter: Payer: Self-pay | Admitting: Obstetrics and Gynecology

## 2011-05-05 ENCOUNTER — Ambulatory Visit (INDEPENDENT_AMBULATORY_CARE_PROVIDER_SITE_OTHER): Payer: Medicaid Other | Admitting: Obstetrics and Gynecology

## 2011-05-05 VITALS — BP 112/76 | HR 75 | Temp 98.8°F | Ht 63.5 in | Wt 113.6 lb

## 2011-05-05 DIAGNOSIS — Z7251 High risk heterosexual behavior: Secondary | ICD-10-CM

## 2011-05-05 MED ORDER — METRONIDAZOLE 500 MG PO TABS: 500.0000 mg | ORAL_TABLET | Freq: Two times a day (BID) | ORAL | Status: AC

## 2011-05-06 LAB — GC/CHLAMYDIA PROBE AMP, GENITAL: GC Probe Amp, Genital: NEGATIVE

## 2011-05-06 LAB — HEPATITIS B SURFACE ANTIBODY,QUALITATIVE: Hep B S Ab: NEGATIVE

## 2011-05-07 NOTE — Progress Notes (Signed)
S: Pt presents today c/o vag dc with some irritation. She also requests STD testing. She denies abd pain, vag bleeding, or any other sx at this time. O: VSS A&Ox3 in NAD Abd soft, nontender NL external genitalia Creamy, white vag dc present in the vault Wet prep shows positive clue cells GC/Chlamydia cultures done. A/P: BV: will tx with Flagyl. Warned of antabuse reaction. Discussed diet, activity, risks, and precautions. Discussed safe sex practices. HIV, RPR, HepB, Hep C drawn.  Clinton Gallant. Kris Burd III, DrHSc, MPAS, PA-C

## 2011-05-21 ENCOUNTER — Encounter: Payer: Self-pay | Admitting: Obstetrics and Gynecology

## 2011-05-21 ENCOUNTER — Other Ambulatory Visit (HOSPITAL_COMMUNITY)
Admission: RE | Admit: 2011-05-21 | Discharge: 2011-05-21 | Disposition: A | Discharge status: Home / Self Care | Payer: Medicaid Other | Source: Ambulatory Visit | Attending: Obstetrics & Gynecology | Admitting: Obstetrics & Gynecology

## 2011-05-21 ENCOUNTER — Ambulatory Visit (INDEPENDENT_AMBULATORY_CARE_PROVIDER_SITE_OTHER): Payer: Medicaid Other | Admitting: Obstetrics and Gynecology

## 2011-05-21 VITALS — BP 115/78 | HR 58 | Temp 97.3°F | Ht 63.0 in | Wt 110.0 lb

## 2011-05-21 DIAGNOSIS — Z01419 Encounter for gynecological examination (general) (routine) without abnormal findings: Secondary | ICD-10-CM

## 2011-05-21 MED ORDER — AZITHROMYCIN 500 MG PO TABS: ORAL_TABLET | ORAL | Status: DC

## 2011-05-21 NOTE — Progress Notes (Signed)
Addended by: Lynnell Dike on: 05/21/2011 10:46 AM   Modules accepted: Orders

## 2011-05-21 NOTE — Progress Notes (Signed)
S: Pt presents today for yearly pap and pe. She states she is doing well and has no complaints. She is also here to f/u on recent STD testing. O: VSS A&Ox3 in NAD HEENT: essentially NL Heart: RRR without murmur or gallop LCTAB Breasts essentially NL. No nodules or massses. No nipple dc. Abd soft, non-tender to palpation. NL external genitalia. Cervix easily visualized. Pap done without difficulty.  Bimanual exam reveals uterus to be NL size and shape. No adnexal masses.  Recent labs reviewed. Pt with positive Chlamydia culture. A/P:Chlamydia: discussed with pt at length. Will tx with Azithromycin 1gm single dose. Discussed safe sex practices. Advised pt to notify all partners and do not have intercourse until everyone has been treated. She understood and agreed.  Yearly Pap/PE: results of Pap should be back in about 2wks. If normal, pt will return in 75yr or prn. Discussed diet, activity, risks, and precautions.  Clinton Gallant. Viola Kinnick III, DrHSc, MPAS, PA-C

## 2011-06-22 ENCOUNTER — Telehealth: Payer: Self-pay | Admitting: *Deleted

## 2011-06-22 NOTE — Telephone Encounter (Signed)
Pt left message requesting Rx.  I returned pt call and discussed her request. She states she needs metronidazole because she is having some d/c after being treated for STD. She also reports having some bleeding. I explained that she will need an evaluation to determine the problem and proper treatment. Pt states she has an appt tomorrow for injection @ 1515. I told her that we can do her evaluation @ that time. Pt voiced understanding.

## 2011-06-23 ENCOUNTER — Other Ambulatory Visit (INDEPENDENT_AMBULATORY_CARE_PROVIDER_SITE_OTHER): Payer: Medicaid Other

## 2011-06-23 ENCOUNTER — Other Ambulatory Visit (INDEPENDENT_AMBULATORY_CARE_PROVIDER_SITE_OTHER): Payer: Medicaid Other | Admitting: Physician Assistant

## 2011-06-23 DIAGNOSIS — N760 Acute vaginitis: Secondary | ICD-10-CM

## 2011-06-23 DIAGNOSIS — Z3049 Encounter for surveillance of other contraceptives: Secondary | ICD-10-CM

## 2011-06-23 MED ORDER — MEDROXYPROGESTERONE ACETATE 150 MG/ML IM SUSP
150.0000 mg | Freq: Once | INTRAMUSCULAR | Status: AC
  Administered 2011-06-23: 150 mg via INTRAMUSCULAR

## 2011-06-23 MED ORDER — TINIDAZOLE 500 MG PO TABS: 1.0000 g | ORAL_TABLET | Freq: Two times a day (BID) | ORAL | Status: AC

## 2011-06-23 NOTE — Progress Notes (Signed)
Chief Complaint:  Vaginal Discharge and Injections   Cindy Hernandez is  36 y.o. [id number].  No LMP recorded. Patient has had an injection..    She presents complaining of Vaginal Discharge and Injections . Reports yellow d/c with odor x 2 weeks. Previously tx'd for BV and Chlamydia in 12/12, desires TOC.  Obstetrical/Gynecological History: OB History    Grav Para Term Preterm Abortions TAB SAB Ect Mult Living   2 2 2  0 0 0 0 0 0 2      Past Medical History: Past Medical History  Diagnosis Date  . Dysfunctional uterine bleeding     Past Surgical History: Past Surgical History  Procedure Date  . Dilation and curettage of uterus     Family History: Family History  Problem Relation Age of Onset  . Cancer Sister     cervical    Social History: History  Substance Use Topics  . Smoking status: Current Everyday Smoker    Types: Cigarettes  . Smokeless tobacco: Not on file  . Alcohol Use: No    Allergies: No Known Allergies   Review of Systems - Negative except what has been reviewed in the HPI  Physical Exam   There were no vitals taken for this visit.  General: General appearance - alert, well appearing, and in no distress, oriented to person, place, and time and normal appearing weight Mental status - alert, oriented to person, place, and time, normal mood, behavior, speech, dress, motor activity, and thought processes, affect appropriate to mood Abdomen - soft, nontender, nondistended, no masses or organomegaly Focused Gynecological Exam: VULVA: normal appearing vulva with no masses, tenderness or lesions, VAGINA: vaginal discharge - malodorous and pink, CERVIX: normal appearing cervix without discharge or lesions, UTERUS: uterus is normal size, shape, consistency and nontender, ADNEXA: normal adnexa in size, nontender and no masses, WET MOUNT done - + Whiff on exam   Assessment: 1. BV 2. TOC   Plan: Rx Tindamax Rec: Rephresh and Probiotic daily. Hygiene and  fragrance free soap, detergent reviewed.  Cindy Hernandez E. 06/23/2011,3:42 PM

## 2011-06-23 NOTE — Patient Instructions (Signed)
Safer Sex Your caregiver wants you to have this information about the infections that can be transmitted from sexual contact and how to prevent them. The idea behind safer sex is that you can be sexually active, and at the same time reduce the risk of giving or getting a sexually transmitted disease (STD). Every person should be aware of how to prevent him or herself and his or her sex partner from getting an STD. CAUSES OF STDS STDs are transmitted by sharing body fluids, which contain viruses and bacteria. The following fluids all transmit infections during sexual intercourse and sex acts:  Semen.   Saliva.   Urine.   Blood.   Vaginal mucus.  Examples of STDs include:  Chlamydia.   Gonorrhea.   Genital herpes.   Hepatitis B.   Human immunodeficiency virus or acquired immunodeficiency syndrome (HIV or AIDS).   Syphilis.   Trichomonas.   Pubic lice.   Human papillomavirus (HPV), which may include:   Genital warts.   Cervical dysplasia.   Cervical cancer (can develop with certain types of HPV).  SYMPTOMS  Sexual diseases often cause few or no symptoms until they are advanced, so a person can be infected and spread the infection without knowing it. Some STDs respond to treatment very well. Others, like HIV and herpes, cannot be cured, but are treated to reduce their effects. Specific symptoms include:  Abnormal vaginal discharge.   Irritation or itching in and around the vagina, and in the pubic hair.   Pain during sexual intercourse.   Bleeding during sexual intercourse.   Pelvic or abdominal pain.   Fever.   Growths in and around the vagina.   An ulcer in or around the vagina.   Swollen glands in the groin area.  DIAGNOSIS   Blood tests.   Pap test.   Culture test of abnormal vaginal discharge.   A test that applies a solution and examines the cervix with a lighted magnifying scope (colposcopy).   A test that examines the pelvis with a lighted  tube, through a small incision (laparoscopy).  TREATMENT  The treatment will depend on the cause of the STD.  Antibiotic treatment by injection, oral, creams, or suppositories in the vagina.   Over-the-counter medicated shampoo, to get rid of pubic lice.   Removing or treating growths with medicine, freezing, burning (electrocautery), or surgery.   Surgery treatment for HPV of the cervix.   Supportive medicines for herpes, HIV, AIDS, and hepatitis.  Being careful cannot eliminate all risk of infection, but sex can be made much safer. Safe sexual practices include body massage and gentle touching. Masturbation is safe, as long as body fluids do not contact skin that has sores or cuts. Dry kissing and oral sex on a man wearing a latex condom or on a woman wearing a female condom is also safe. Slightly less safe is intercourse while the man wears a latex condom or wet kissing. It is also safer to have one sex partner that you know is not having sex with anyone else. LENGTH OF ILLNESS An STD might be treated and cured in a week, sometimes a month, or more. And it can linger with symptoms for many years. STDs can also cause damage to the female organs. This can cause chronic pain, infertility, and recurrence of the STD, especially herpes, hepatitis, HIV, and HPV. HOME CARE INSTRUCTIONS AND PREVENTION  Alcohol and recreational drugs are often the reason given for not practicing safer sex. These substances affect  your judgment. Alcohol and recreational drugs can also impair your immune system, making you more vulnerable to disease.   Do not engage in risky and dangerous sexual practices, including:   Vaginal or anal sex without a condom.   Oral sex on a man without a condom.   Oral sex on a woman without a female condom.   Using saliva to lubricate a condom.   Any other sexual contact in which body fluids or blood from one partner contact the other partner.   You should use only latex  condoms for men and water soluble lubricants. Petroleum based lubricants or oils used to lubricate a condom will weaken the condom and increase the chance that it will break.   Think very carefully before having sex with anyone who is high risk for STDs and HIV. This includes IV drug users, people with multiple sexual partners, or people who have had an STD, or a positive hepatitis or HIV blood test.   Remember that even if your partner has had only one previous partner, their previous partner might have had multiple partners. If so, you are at high risk of being exposed to an STD. You and your sex partner should be the only sex partners with each other, with no one else involved.   A vaccine is available for hepatitis B and HPV through your caregiver or the Public Health Department. Everyone should be vaccinated with these vaccines.   Avoid risky sex practices. Sex acts that can break the skin make you more likely to get an STD.  SEEK MEDICAL CARE IF:   If you think you have an STD, even if you do not have any symptoms. Contact your caregiver for evaluation and treatment, if needed.   You think or know your sex partner has acquired an STD.   You have any of the symptoms mentioned above.  Document Released: 06/24/2004 Document Revised: 01/27/2011 Document Reviewed: 04/16/2009 [facility] Patient Information 2012 [place], [place].Bacterial Vaginosis Bacterial vaginosis (BV) is a vaginal infection where the normal balance of bacteria in the vagina is disrupted. The normal balance is then replaced by an overgrowth of certain bacteria. There are several different kinds of bacteria that can cause BV. BV is the most common vaginal infection in women of childbearing age. CAUSES   The cause of BV is not fully understood. BV develops when there is an increase or imbalance of harmful bacteria.   Some activities or behaviors can upset the normal balance of bacteria in the vagina and put women at increased  risk including:   Having a new sex partner or multiple sex partners.   Douching.   Using an intrauterine device (IUD) for contraception.   It is not clear what role sexual activity plays in the development of BV. However, women that have never had sexual intercourse are rarely infected with BV.  Women do not get BV from toilet seats, bedding, swimming pools or from touching objects around them.  SYMPTOMS   Grey vaginal discharge.   A fish-like odor with discharge, especially after sexual intercourse.   Itching or burning of the vagina and vulva.   Burning or pain with urination.   Some women have no signs or symptoms at all.  DIAGNOSIS  Your caregiver must examine the vagina for signs of BV. Your caregiver will perform lab tests and look at the sample of vaginal fluid through a microscope. They will look for bacteria and abnormal cells (clue cells), a pH test higher than  4.5, and a positive amine test all associated with BV.  RISKS AND COMPLICATIONS   Pelvic inflammatory disease (PID).   Infections following gynecology surgery.   Developing HIV.   Developing herpes virus.  TREATMENT  Sometimes BV will clear up without treatment. However, all women with symptoms of BV should be treated to avoid complications, especially if gynecology surgery is planned. Female partners generally do not need to be treated. However, BV may spread between female sex partners so treatment is helpful in preventing a recurrence of BV.   BV may be treated with antibiotics. The antibiotics come in either pill or vaginal cream forms. Either can be used with nonpregnant or pregnant women, but the recommended dosages differ. These antibiotics are not harmful to the baby.   BV can recur after treatment. If this happens, a second round of antibiotics will often be prescribed.   Treatment is important for pregnant women. If not treated, BV can cause a premature delivery, especially for a pregnant woman who had  a premature birth in the past. All pregnant women who have symptoms of BV should be checked and treated.   For chronic reoccurrence of BV, treatment with a type of prescribed gel vaginally twice a week is helpful.  HOME CARE INSTRUCTIONS   Finish all medication as directed by your caregiver.   Do not have sex until treatment is completed.   Tell your sexual partner that you have a vaginal infection. They should see their caregiver and be treated if they have problems, such as a mild rash or itching.   Practice safe sex. Use condoms. Only have 1 sex partner.  PREVENTION  Basic prevention steps can help reduce the risk of upsetting the natural balance of bacteria in the vagina and developing BV:  Do not have sexual intercourse (be abstinent).   Do not douche.   Use all of the medicine prescribed for treatment of BV, even if the signs and symptoms go away.   Tell your sex partner if you have BV. That way, they can be treated, if needed, to prevent reoccurrence.  SEEK MEDICAL CARE IF:   Your symptoms are not improving after 3 days of treatment.   You have increased discharge, pain, or fever.  MAKE SURE YOU:   Understand these instructions.   Will watch your condition.   Will get help right away if you are not doing well or get worse.  FOR MORE INFORMATION  Division of STD Prevention (DSTDP), Centers for Disease Control and Prevention: [employer] American [employer] Health Association (ASHA): www.ashastd.org  Document Released: 05/17/2005 Document Revised: 01/27/2011 Document Reviewed: 11/07/2008 [facility] Patient Information 2012 [place], [place].

## 2011-06-23 NOTE — Progress Notes (Signed)
Patient here for next depoprovera- also complains of yellow vaginal discharge and vaginal itching, states was treated for chlamydia and bacterial vaginosis in December

## 2011-06-24 LAB — WET PREP, GENITAL: Trich, Wet Prep: NONE SEEN

## 2011-06-24 LAB — GC/CHLAMYDIA PROBE AMP, URINE: GC Probe Amp, Urine: NEGATIVE

## 2011-09-08 ENCOUNTER — Ambulatory Visit: Payer: Medicaid Other

## 2011-09-09 ENCOUNTER — Ambulatory Visit (INDEPENDENT_AMBULATORY_CARE_PROVIDER_SITE_OTHER): Payer: Medicaid Other | Admitting: *Deleted

## 2011-09-09 VITALS — BP 107/75 | HR 73 | Ht 63.0 in | Wt 113.9 lb

## 2011-09-09 DIAGNOSIS — N926 Irregular menstruation, unspecified: Secondary | ICD-10-CM

## 2011-09-09 DIAGNOSIS — N938 Other specified abnormal uterine and vaginal bleeding: Secondary | ICD-10-CM

## 2011-09-09 MED ORDER — MEDROXYPROGESTERONE ACETATE 150 MG/ML IM SUSP
150.0000 mg | Freq: Once | INTRAMUSCULAR | Status: AC
  Administered 2011-09-09: 150 mg via INTRAMUSCULAR

## 2011-11-25 ENCOUNTER — Ambulatory Visit: Payer: Medicaid Other

## 2011-12-06 ENCOUNTER — Ambulatory Visit: Payer: Medicaid Other | Admitting: Obstetrics & Gynecology

## 2011-12-07 ENCOUNTER — Ambulatory Visit: Payer: Medicaid Other | Admitting: *Deleted

## 2011-12-27 ENCOUNTER — Ambulatory Visit: Payer: Medicaid Other | Admitting: Obstetrics & Gynecology

## 2011-12-29 ENCOUNTER — Encounter: Payer: Self-pay | Admitting: Obstetrics & Gynecology

## 2012-01-26 ENCOUNTER — Encounter: Payer: Self-pay | Admitting: Obstetrics and Gynecology

## 2012-01-26 ENCOUNTER — Ambulatory Visit (INDEPENDENT_AMBULATORY_CARE_PROVIDER_SITE_OTHER): Payer: Medicaid Other | Admitting: Obstetrics and Gynecology

## 2012-01-26 VITALS — BP 108/81 | HR 72 | Temp 97.7°F | Ht 63.5 in | Wt 119.0 lb

## 2012-01-26 DIAGNOSIS — Z113 Encounter for screening for infections with a predominantly sexual mode of transmission: Secondary | ICD-10-CM

## 2012-01-26 NOTE — Progress Notes (Signed)
36 yo G2P2 with LMP 12/11/2011 and BMI 20 presenting today for evaluation of amenorrhea and vaginal discharge. Patient states that she stopped depo-provera two months ago as she is trying to conceive. She states that she only noted spotting upon wiping for a few days but no real period. Patient also noted a vaginal discharge which she describes as yellow with an occasional odor. Patient was treated for chlamydia in December 2012. Patient desires full STD testing.  Pelvic exam: Normal vaginal mucosa and cervix. No abnormal discharge or odor appreciated.   A/P 36 yo G2P2 with amenorrhea s/p depo-provera and vaginal discharge - It was explained to the patient that return of normal menstrual cycle can take up to a year after discontinuing depo-provera. - Patient advised to keep track of menstrual calendar - Patient advised to take PNV during this preconception period - Wet prep, cervical culture and STD panel ordered - Patient will be contacted with any abnormal results

## 2012-01-27 LAB — WET PREP, GENITAL
Clue Cells Wet Prep HPF POC: NONE SEEN
Trich, Wet Prep: NONE SEEN

## 2012-01-27 LAB — RPR

## 2012-01-27 LAB — HEPATITIS B SURFACE ANTIGEN: Hepatitis B Surface Ag: NEGATIVE

## 2012-02-01 ENCOUNTER — Telehealth: Payer: Self-pay | Admitting: Medical

## 2012-02-01 NOTE — Telephone Encounter (Signed)
Patient called wanting test results.

## 2012-02-01 NOTE — Telephone Encounter (Signed)
Attempted to contact patient at both contact numbers. No message at "home" number and "mobile" number states "the [employer] number you are trying to call is unavailable either because the phone is off or they are outside of the area"

## 2012-02-02 NOTE — Telephone Encounter (Signed)
Called patient back and stated we had received her message about wanting test results. Informed the patient that all her results from her Visit on 8/28 were all within normal limits. Patient stated she had been having a yellow d/c and wanted to make sure she didn't have any type of infection, patient denied itching or odor. Informed patient to give [place] a call back if the d/c got worse, started to develop an odor or any itching or burning. Patient verbalized understanding and had no further questions.

## 2012-07-10 ENCOUNTER — Ambulatory Visit: Payer: Medicaid Other | Admitting: Obstetrics & Gynecology

## 2012-07-12 ENCOUNTER — Encounter: Payer: Self-pay | Admitting: Obstetrics & Gynecology

## 2012-07-12 ENCOUNTER — Other Ambulatory Visit (HOSPITAL_COMMUNITY)
Admission: RE | Admit: 2012-07-12 | Discharge: 2012-07-12 | Disposition: A | Discharge status: Home / Self Care | Payer: Medicaid Other | Source: Ambulatory Visit | Attending: Obstetrics & Gynecology | Admitting: Obstetrics & Gynecology

## 2012-07-12 ENCOUNTER — Ambulatory Visit (INDEPENDENT_AMBULATORY_CARE_PROVIDER_SITE_OTHER): Payer: Medicaid Other | Admitting: Obstetrics & Gynecology

## 2012-07-12 VITALS — BP 107/73 | HR 69 | Temp 96.6°F | Ht 63.0 in | Wt 121.5 lb

## 2012-07-12 DIAGNOSIS — Z113 Encounter for screening for infections with a predominantly sexual mode of transmission: Secondary | ICD-10-CM | POA: Insufficient documentation

## 2012-07-12 DIAGNOSIS — Z01419 Encounter for gynecological examination (general) (routine) without abnormal findings: Secondary | ICD-10-CM

## 2012-07-12 DIAGNOSIS — Z1151 Encounter for screening for human papillomavirus (HPV): Secondary | ICD-10-CM | POA: Insufficient documentation

## 2012-07-12 DIAGNOSIS — N760 Acute vaginitis: Secondary | ICD-10-CM | POA: Insufficient documentation

## 2012-07-12 NOTE — Patient Instructions (Signed)
Sexually Transmitted Disease  Sexually transmitted disease (STD) refers to any infection that is passed from person to person during sexual activity. This may happen by way of saliva, semen, blood, vaginal mucus, or urine. Common STDs include:   Gonorrhea.   Chlamydia.   Syphilis.   HIV/AIDS.   Genital herpes.   Hepatitis B and C.   Trichomonas.   Human papillomavirus (HPV).   Pubic lice.  CAUSES   An STD may be spread by bacteria, virus, or parasite. A person can get an STD by:   Sexual intercourse with an infected person.   Sharing sex toys with an infected person.   Sharing needles with an infected person.   Having intimate contact with the genitals, mouth, or rectal areas of an infected person.  SYMPTOMS   Some people may not have any symptoms, but they can still pass the infection to others. Different STDs have different symptoms. Symptoms include:   Painful or bloody urination.   Pain in the pelvis, abdomen, vagina, anus, throat, or eyes.   Skin rash, itching, irritation, growths, or sores (lesions). These usually occur in the genital or anal area.   Abnormal vaginal discharge.   Penile discharge in men.   Soft, flesh-colored skin growths in the genital or anal area.   Fever.   Pain or bleeding during sexual intercourse.   Swollen glands in the groin area.   Yellow skin and eyes (jaundice). This is seen with hepatitis.  DIAGNOSIS   To make a diagnosis, your caregiver may:   Take a medical history.   Perform a physical exam.   Take a specimen (culture) to be examined.   Examine a sample of discharge under a microscope.   Perform blood tests.   Perform a Pap test, if this applies.   Perform a colposcopy.   Perform a laparoscopy.  TREATMENT    Chlamydia, gonorrhea, trichomonas, and syphilis can be cured with antibiotic medicine.   Genital herpes, hepatitis, and HIV can be treated, but not cured, with prescribed medicines. The medicines will lessen the symptoms.   Genital warts  from HPV can be treated with medicine or by freezing, burning (electrocautery), or surgery. Warts may come back.   HPV is a virus and cannot be cured with medicine or surgery.However, abnormal areas may be followed very closely by your caregiver and may be removed from the cervix, vagina, or vulva through office procedures or surgery.  If your diagnosis is confirmed, your recent sexual partners need treatment. This is true even if they are symptom-free or have a negative culture or evaluation. They should not have sex until their caregiver says it is okay.  HOME CARE INSTRUCTIONS   All sexual partners should be informed, tested, and treated for all STDs.   Take your antibiotics as directed. Finish them even if you start to feel better.   Only take over-the-counter or prescription medicines for pain, discomfort, or fever as directed by your caregiver.   Rest.   Eat a balanced diet and drink enough fluids to keep your urine clear or pale yellow.   Do not have sex until treatment is completed and you have followed up with your caregiver. STDs should be checked after treatment.   Keep all follow-up appointments, Pap tests, and blood tests as directed by your caregiver.   Only use latex condoms and water-soluble lubricants during sexual activity. Do not use petroleum jelly or oils.   Avoid alcohol and illegal drugs.   Get vaccinated   for HPV and hepatitis. If you have not received these vaccines in the past, talk to your caregiver about whether one or both might be right for you.   Avoid risky sex practices that can break the skin.  The only way to avoid getting an STD is to avoid all sexual activity.Latex condoms and dental dams (for oral sex) will help lessen the risk of getting an STD, but will not completely eliminate the risk.  SEEK MEDICAL CARE IF:    You have a fever.   You have any new or worsening symptoms.  Document Released: 08/07/2002 Document Revised: 08/09/2011 Document Reviewed:  08/14/2010  [employer] Patient Information 2013 [employer].

## 2012-07-12 NOTE — Progress Notes (Signed)
Subjective:     Patient ID: Cindy Hernandez, female   DOB: [birth year], 37 y.o.   MRN: [id number]  HPI Pt presents for GYN eval and STI eval.  Pt has no h/o exposure but, is worried about an STI.  She is interested in getting pregnant in the future.  Past Medical History  Diagnosis Date  . Dysfunctional uterine bleeding    Past Surgical History  Procedure Laterality Date  . Dilation and curettage of uterus     Current Outpatient Prescriptions on File Prior to Visit  Medication Sig Dispense Refill  . azithromycin (ZITHROMAX) 500 MG tablet Take both tabs as a single dose by mouth.  2 tablet  0  . diphenhydrAMINE (BENADRYL) 25 MG tablet Take 25 mg by mouth every 6 (six) hours as needed.        . medroxyPROGESTERone (DEPO-PROVERA) 150 MG/ML injection Inject 150 mg into the muscle every 3 (three) months.         No current facility-administered medications on file prior to visit.  No Known Allergies History   Social History  . Marital Status: Single    Spouse Name: N/A    Number of Children: N/A  . Years of Education: N/A   Occupational History  . Not on file.   Social History Main Topics  . Smoking status: Current Every Day Smoker    Types: Cigarettes  . Smokeless tobacco: Not on file  . Alcohol Use: No  . Drug Use: Yes    Special: Marijuana  . Sexually Active: Yes   Other Topics Concern  . Not on file   Social History Narrative  . 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  She denies IVDA      Review of Systems     Objective:   Physical Exam BP 107/73  Pulse 69  Temp(Src) 96.6 F (35.9 C) (Oral)  Ht 5\' 3"  (1.6 m)  Wt 121 lb 8 oz (55.112 kg)  BMI 21.53 kg/m2  LMP 06/18/2012 Lungs: CTA CV: RRR Abd: soft, NT, ND GU: EGBUS: no lesions Vagina: no blood in vault Cervix: no lesion; no mucopurulent d/c Uterus: small, mobile Adnexa: no masses; non tender        Assessment:     Annual GYN STI screening     Plan:     F/u PAP Labs:  HIV, RPR,  Hep panel Cx: GC, Chlamydia, Trich  f/u 4 weeks for results review

## 2012-07-14 LAB — HEPATITIS PANEL, ACUTE
Hep A IgM: NEGATIVE
Hep B C IgM: NEGATIVE

## 2012-07-18 ENCOUNTER — Telehealth: Payer: Self-pay | Admitting: *Deleted

## 2012-07-18 NOTE — Telephone Encounter (Signed)
Left message at patients home number for patient to call us back. Mobile number is not working.

## 2012-07-18 NOTE — Telephone Encounter (Signed)
Message copied by Mannie Stabile on Tue Jul 18, 2012  1:08 PM ------      Message from: Willodean Rosenthal      Created: Mon Jul 17, 2012 11:46 AM       Please call pt and notify her of all neg serum STI labs.             Thx,      clh-S  ------

## 2012-07-18 NOTE — Telephone Encounter (Signed)
Patient informed of results.  

## 2012-08-09 ENCOUNTER — Ambulatory Visit: Payer: Medicaid Other | Admitting: Obstetrics & Gynecology

## 2012-11-07 ENCOUNTER — Encounter (HOSPITAL_COMMUNITY): Payer: Self-pay | Admitting: Emergency Medicine

## 2012-11-07 ENCOUNTER — Emergency Department (HOSPITAL_COMMUNITY)
Admission: EM | Admit: 2012-11-07 | Discharge: 2012-11-07 | Disposition: A | Discharge status: Home / Self Care | Payer: Medicaid Other | Source: Home / Self Care | Attending: Emergency Medicine | Admitting: Emergency Medicine

## 2012-11-07 DIAGNOSIS — T192XXA Foreign body in vulva and vagina, initial encounter: Secondary | ICD-10-CM

## 2012-11-07 NOTE — ED Notes (Signed)
Pt c/o condom stuck in vagina. Incident happened an hour ago. Denies any other problems.

## 2012-11-07 NOTE — ED Provider Notes (Signed)
History     CSN: [id number]  Arrival date & time 11/07/12  1700   First MD Initiated Contact with Patient 11/07/12 1830      Chief Complaint  Patient presents with  . Foreign Body in Vagina    condom stuck in vagina. x 1 hour.     (Consider location/radiation/quality/duration/timing/severity/associated sxs/prior treatment) HPI Comments: 37 year old female presents complaining of having a condom stuck in her vagina since about 2 hours ago. She was having intercourse and her partner felt the condom get stuck. They stopped having intercourse and he did not ejaculate inside her. She states she can feel the condom with the tip of her finger but could not quite get a hold of it to remove it. She denies any pain or bleeding.  Patient is a 37 y.o. female presenting with foreign body in vagina.  Foreign Body in Vagina Pertinent negatives include no chest pain, no abdominal pain and no shortness of breath.    Past Medical History  Diagnosis Date  . Dysfunctional uterine bleeding     Past Surgical History  Procedure Laterality Date  . Dilation and curettage of uterus      Family History  Problem Relation Age of Onset  . Cancer Sister     cervical    History  Substance Use Topics  . Smoking status: Current Every Day Smoker    Types: Cigarettes  . Smokeless tobacco: Not on file  . Alcohol Use: No    OB History   Grav Para Term Preterm Abortions TAB SAB Ect Mult Living   2 2 2  0 0 0 0 0 0 2      Review of Systems  Constitutional: Negative for fever and chills.  Eyes: Negative for visual disturbance.  Respiratory: Negative for cough and shortness of breath.   Cardiovascular: Negative for chest pain, palpitations and leg swelling.  Gastrointestinal: Negative for nausea, vomiting and abdominal pain.  Endocrine: Negative for polydipsia and polyuria.  Genitourinary: Negative for dysuria, urgency and frequency.       See HPI   Musculoskeletal: Negative for myalgias and  arthralgias.  Skin: Negative for rash.  Neurological: Negative for dizziness, weakness and light-headedness.    Allergies  Review of patient's allergies indicates no known allergies.  Home Medications   Current Outpatient Rx  Name  Route  Sig  Dispense  Refill  . azithromycin (ZITHROMAX) 500 MG tablet      Take both tabs as a single dose by mouth.   2 tablet   0   . diphenhydrAMINE (BENADRYL) 25 MG tablet   Oral   Take 25 mg by mouth every 6 (six) hours as needed.           . medroxyPROGESTERone (DEPO-PROVERA) 150 MG/ML injection   Intramuscular   Inject 150 mg into the muscle every 3 (three) months.             BP 104/61  Pulse 73  Temp(Src) 98 F (36.7 C) (Oral)  Resp 16  SpO2 100%  LMP 10/27/2012  Physical Exam  Constitutional: She is oriented to person, place, and time. She appears well-developed and well-nourished. No distress.  HENT:  Head: Normocephalic and atraumatic.  Genitourinary: There is a foreign body (condom, removed with sponge stick  ) around the vagina.  Musculoskeletal: Normal range of motion.  Neurological: She is alert and oriented to person, place, and time.  Skin: Skin is dry. No rash noted.  Psychiatric: She has a normal mood  and affect. Judgment normal.    ED Course  Procedures (including critical care time)  Labs Reviewed - No data to display No results found.   1. Foreign body in vagina, initial encounter       MDM  This foreign body was successfully removed the speculum exam. Was able to reach the common with a sponge stick and removed. The entire condom was removed, no pieces were retained in the vagina. Followup when necessary        Graylon Good, PA-C 11/07/12 1900

## 2012-11-07 NOTE — ED Provider Notes (Signed)
Medical screening examination/treatment/procedure(s) were performed by non-physician practitioner and as supervising physician I was immediately available for consultation/collaboration.  Raynald Blend, MD 11/07/12 1939

## 2013-01-04 ENCOUNTER — Ambulatory Visit: Payer: Medicaid Other | Admitting: Obstetrics & Gynecology

## 2013-02-26 ENCOUNTER — Encounter: Payer: Self-pay | Admitting: Obstetrics & Gynecology

## 2013-02-26 ENCOUNTER — Ambulatory Visit (INDEPENDENT_AMBULATORY_CARE_PROVIDER_SITE_OTHER): Payer: Medicaid Other | Admitting: Obstetrics & Gynecology

## 2013-02-26 VITALS — BP 104/74 | HR 76 | Temp 97.1°F | Ht 63.0 in | Wt 118.9 lb

## 2013-02-26 DIAGNOSIS — N898 Other specified noninflammatory disorders of vagina: Secondary | ICD-10-CM

## 2013-02-26 NOTE — Progress Notes (Signed)
Patient ID: Cindy Hernandez, female   DOB: 28-Oct-1975, 37 y.o.   MRN: [id number]  Chief Complaint  Patient presents with  . Vaginal Discharge    odor after intercourse   . Menstrual Problem    HPI [name] is a 37 y.o. female.  [id number] Patient's last menstrual period was 02/05/2013. Several weeks of vaginal discharge and odor after intercourse. Treated for UTI in [place] no relief. No itch or burning  HPI  Past Medical History  Diagnosis Date  . Dysfunctional uterine bleeding     Past Surgical History  Procedure Laterality Date  . Dilation and curettage of uterus      Family History  Problem Relation Age of Onset  . Cancer Sister     cervical    Social History History  Substance Use Topics  . Smoking status: Current Every Day Smoker    Types: Cigarettes  . Smokeless tobacco: Not on file  . Alcohol Use: No    No Known Allergies  Current Outpatient Prescriptions  Medication Sig Dispense Refill  . diphenhydrAMINE (BENADRYL) 25 MG tablet Take 25 mg by mouth every 6 (six) hours as needed.         No current facility-administered medications for this visit.    Review of Systems Review of Systems  Constitutional: Negative.  Negative for fever.  Gastrointestinal: Negative for nausea and abdominal pain.  Genitourinary: Positive for vaginal discharge. Negative for vaginal bleeding, vaginal pain, menstrual problem and pelvic pain.    Blood pressure 104/74, pulse 76, temperature 97.1 F (36.2 C), temperature source Oral, height 5\' 3"  (1.6 m), weight 118 lb 14.4 oz (53.933 kg), last menstrual period 02/05/2013.  Physical Exam Physical Exam  Constitutional: She is oriented to person, place, and time. She appears well-developed and well-nourished. No distress.  Pulmonary/Chest: Effort normal. No respiratory distress.  Genitourinary: Uterus normal. Vaginal discharge (white) found.  Not tender, no mass Wet prep done  Neurological: She is alert and oriented to  person, place, and time.  Skin: Skin is warm and dry.  Psychiatric: She has a normal mood and affect. Her behavior is normal.    Data Reviewed Office notes  Assessment    Vaginal discharge possible yeast vs. BV     Plan    F/u result of wet prep        ARNOLD,JAMES 02/26/2013, 4:01 PM

## 2013-02-27 LAB — WET PREP, GENITAL
Trich, Wet Prep: NONE SEEN
Yeast Wet Prep HPF POC: NONE SEEN

## 2013-02-28 ENCOUNTER — Telehealth: Payer: Self-pay | Admitting: General Practice

## 2013-02-28 DIAGNOSIS — B9689 Other specified bacterial agents as the cause of diseases classified elsewhere: Secondary | ICD-10-CM

## 2013-02-28 MED ORDER — TINIDAZOLE 500 MG PO TABS: 2.0000 g | ORAL_TABLET | Freq: Every day | ORAL | Status: AC

## 2013-02-28 NOTE — Telephone Encounter (Signed)
Called patient and informed her of results and medication available for pickup. Patient asked what the name of the medication was and I told her flagyl. Patient stated she could not take that stuff because it makes her sick. Told patient I will switch it to a different medication. Patient was satisfied, verbalized understanding and had no further questions

## 2013-02-28 NOTE — Telephone Encounter (Signed)
Message copied by Kathee Delton on Wed Feb 28, 2013 10:51 AM ------      Message from: Adam Phenix      Created: Tue Feb 27, 2013  5:32 PM       Flagyl 500 mg BID for 7 days for BV ------

## 2013-03-14 ENCOUNTER — Telehealth: Payer: Self-pay

## 2013-03-14 MED ORDER — METRONIDAZOLE 500 MG PO TABS: 500.0000 mg | ORAL_TABLET | Freq: Two times a day (BID) | ORAL | Status: DC

## 2013-03-14 NOTE — Telephone Encounter (Signed)
Pt called and stated that she need another refill on her med cause she took to long to go pick it up.   Pt called the front desk and stated that she needed another refill on her med for BV.  I reordered the Rx and verified pharmacy.

## 2013-05-31 NOTE — L&D Delivery Note (Signed)
Delivery Note The water injections offered a modest amount of relief; however, the urge to push became unbearable.  Over one push, the edematous anterior lip was reduced.  Over the next push, at 12:39 AM a viable female was delivered via  (Presentation: ROA  ). The shoulders were not forthcoming, so the posterior (Right) axilla was grasped with my index finger, and the baby was rotated clockwise into the oblique diameter.  At this point, the (now) anterior shoulder was released, and the baby delivered.  At no time was any traction placed on the baby's head.  APGAR: 8/9 ; weight 6#4oz.   Placenta status: intact with 3V Cord:  with the following complications: none  Anesthesia:  none Episiotomy: none Lacerations: none Suture Repair: n/a Est. Blood Loss (mL): 50  Mom to postpartum.  Baby to Couplet care / Skin to Skin.  CRESENZO-DISHMAN,Kenn Rekowski 01/18/2014, 12:53 AM

## 2013-06-04 ENCOUNTER — Encounter (HOSPITAL_COMMUNITY): Payer: Self-pay

## 2013-06-04 ENCOUNTER — Inpatient Hospital Stay (HOSPITAL_COMMUNITY): Payer: Medicaid Other

## 2013-06-04 ENCOUNTER — Inpatient Hospital Stay (HOSPITAL_COMMUNITY)
Admission: AD | Admit: 2013-06-04 | Discharge: 2013-06-04 | Disposition: A | Discharge status: Home / Self Care | Payer: Medicaid Other | Source: Ambulatory Visit | Attending: Obstetrics & Gynecology | Admitting: Obstetrics & Gynecology

## 2013-06-04 DIAGNOSIS — O210 Mild hyperemesis gravidarum: Secondary | ICD-10-CM | POA: Insufficient documentation

## 2013-06-04 DIAGNOSIS — Z1389 Encounter for screening for other disorder: Secondary | ICD-10-CM

## 2013-06-04 DIAGNOSIS — O98819 Other maternal infectious and parasitic diseases complicating pregnancy, unspecified trimester: Secondary | ICD-10-CM | POA: Insufficient documentation

## 2013-06-04 DIAGNOSIS — A5901 Trichomonal vulvovaginitis: Secondary | ICD-10-CM

## 2013-06-04 DIAGNOSIS — Z3490 Encounter for supervision of normal pregnancy, unspecified, unspecified trimester: Secondary | ICD-10-CM

## 2013-06-04 DIAGNOSIS — O23591 Infection of other part of genital tract in pregnancy, first trimester: Secondary | ICD-10-CM

## 2013-06-04 LAB — URINALYSIS, ROUTINE W REFLEX MICROSCOPIC
Bilirubin Urine: NEGATIVE
Glucose, UA: NEGATIVE mg/dL
Hgb urine dipstick: NEGATIVE
Ketones, ur: NEGATIVE mg/dL
NITRITE: POSITIVE — AB
PH: 6 (ref 5.0–8.0)
Protein, ur: NEGATIVE mg/dL
SPECIFIC GRAVITY, URINE: 1.025 (ref 1.005–1.030)
UROBILINOGEN UA: 0.2 mg/dL (ref 0.0–1.0)

## 2013-06-04 LAB — URINE MICROSCOPIC-ADD ON

## 2013-06-04 LAB — WET PREP, GENITAL
Clue Cells Wet Prep HPF POC: NONE SEEN
YEAST WET PREP: NONE SEEN

## 2013-06-04 LAB — POCT PREGNANCY, URINE: PREG TEST UR: POSITIVE — AB

## 2013-06-04 MED ORDER — METRONIDAZOLE 500 MG PO TABS: 2000.0000 mg | ORAL_TABLET | Freq: Once | ORAL | Status: DC

## 2013-06-04 MED ORDER — ONDANSETRON HCL 4 MG PO TABS: 8.0000 mg | ORAL_TABLET | Freq: Once | ORAL | Status: DC

## 2013-06-04 MED ORDER — PROMETHAZINE HCL 25 MG PO TABS: 25.0000 mg | ORAL_TABLET | Freq: Four times a day (QID) | ORAL | Status: DC | PRN

## 2013-06-04 NOTE — MAU Provider Note (Signed)
History     CSN: [id number]  Arrival date and time: 06/04/13 1405   First Provider Initiated Contact with Patient 06/04/13 1540      Chief Complaint  Patient presents with  . Possible Pregnancy  . Vaginal Discharge   HPI This is a 38 y.o. female at [redacted]w[redacted]d who presents with c/o vaginal discharge with odor. Has had BV several times.   RN Note: Patient states she has missed her period. States she is being treated for BV but continues to have a vaginal discharge that is now yellow with an odor. Denies bleeding or pain at this time.        OB History   Grav Para Term Preterm Abortions TAB SAB Ect Mult Living   3 2 2  0 0 0 0 0 0 2      Past Medical History  Diagnosis Date  . Dysfunctional uterine bleeding     Past Surgical History  Procedure Laterality Date  . Dilation and curettage of uterus      Family History  Problem Relation Age of Onset  . Cancer Sister     cervical    History  Substance Use Topics  . Smoking status: Current Every Day Smoker    Types: Cigarettes  . Smokeless tobacco: Not on file  . Alcohol Use: No    Allergies:  Allergies  Allergen Reactions  . Flagyl [Metronidazole] Nausea And Vomiting    Prescriptions prior to admission  Medication Sig Dispense Refill  . Acetaminophen (PAIN RELIEF PO) Take 3 tablets by mouth daily as needed (headache).        Review of Systems  Constitutional: Negative for fever, chills and malaise/fatigue.  Gastrointestinal: Negative for nausea, vomiting, abdominal pain, diarrhea and constipation.  Genitourinary:       Vaginal discharge with odor    Physical Exam   Blood pressure 102/67, pulse 79, temperature 98.3 F (36.8 C), temperature source Oral, resp. rate 16, height 5' 2.5" (1.588 m), weight 54.25 kg (119 lb 9.6 oz), last menstrual period 04/19/2013, SpO2 99.00%.  Physical Exam  Constitutional: She is oriented to person, place, and time.  GI: She exhibits no mass. There is tenderness (RLQ to  palpation). There is no rebound and no guarding.  Genitourinary: Vagina normal and uterus normal. No vaginal discharge found.  Uterus 5-6 weeks size Adnexae tender on right  Musculoskeletal: Normal range of motion.  Neurological: She is alert and oriented to person, place, and time.  Skin: Skin is warm and dry.  Psychiatric: She has a normal mood and affect.    MAU Course  Procedures  MDM US showed pregnancy IUP at 6.4wks, size = dates, HR 126  Results for orders placed during the hospital encounter of 06/04/13 (from the past 24 hour(s))  URINALYSIS, ROUTINE W REFLEX MICROSCOPIC     Status: Abnormal   Collection Time    06/04/13  2:20 PM      Result Value Range   Color, Urine YELLOW  YELLOW   APPearance HAZY (*) CLEAR   Specific Gravity, Urine 1.025  1.005 - 1.030   pH 6.0  5.0 - 8.0   Glucose, UA NEGATIVE  NEGATIVE mg/dL   Hgb urine dipstick NEGATIVE  NEGATIVE   Bilirubin Urine NEGATIVE  NEGATIVE   Ketones, ur NEGATIVE  NEGATIVE mg/dL   Protein, ur NEGATIVE  NEGATIVE mg/dL   Urobilinogen, UA 0.2  0.0 - 1.0 mg/dL   Nitrite POSITIVE (*) NEGATIVE   Leukocytes, UA MODERATE (*) NEGATIVE  URINE MICROSCOPIC-ADD ON     Status: Abnormal   Collection Time    06/04/13  2:20 PM      Result Value Range   Squamous Epithelial / LPF MANY (*) RARE   WBC, UA 11-20  <3 WBC/hpf   Bacteria, UA MANY (*) RARE  POCT PREGNANCY, URINE     Status: Abnormal   Collection Time    06/04/13  2:32 PM      Result Value Range   Preg Test, Ur POSITIVE (*) NEGATIVE  WET PREP, GENITAL     Status: Abnormal   Collection Time    06/04/13  4:00 PM      Result Value Range   Yeast Wet Prep HPF POC NONE SEEN  NONE SEEN   Trich, Wet Prep FEW (*) NONE SEEN   Clue Cells Wet Prep HPF POC NONE SEEN  NONE SEEN   WBC, Wet Prep HPF POC FEW (*) NONE SEEN    Assessment and Plan  A:  Viable SIUP at [redacted]w[redacted]d      Trichomonas       Nausea and vomiting of pregnancy  P:   Discussed findings       Rx Flagyl, encouraged  partner to be treated       Rx Zofran for nausea       Start prenatal care        Proof of pregnancy letter provided  [facility] 06/04/2013, 4:00 PM

## 2013-06-04 NOTE — Discharge Instructions (Signed)
Trichomoniasis Trichomoniasis is an infection, caused by the Trichomonas organism, that affects both women and men. In women, the outer female genitalia and the vagina are affected. In men, the penis is mainly affected, but the prostate and other reproductive organs can also be involved. Trichomoniasis is a sexually transmitted disease (STD) and is most often passed to another person through sexual contact. The majority of people who get trichomoniasis do so from a sexual encounter and are also at risk for other STDs. CAUSES   Sexual intercourse with an infected partner.  It can be present in swimming pools or hot tubs. SYMPTOMS   Abnormal gray-green frothy vaginal discharge in women.  Vaginal itching and irritation in women.  Itching and irritation of the area outside the vagina in women.  Penile discharge with or without pain in males.  Inflammation of the urethra (urethritis), causing painful urination.  Bleeding after sexual intercourse. RELATED COMPLICATIONS  Pelvic inflammatory disease.  Infection of the uterus (endometritis).  Infertility.  Tubal (ectopic) pregnancy.  It can be associated with other STDs, including gonorrhea and chlamydia, hepatitis B, and HIV. COMPLICATIONS DURING PREGNANCY  Early (premature) delivery.  Premature rupture of the membranes (PROM).  Low birth weight. DIAGNOSIS   Visualization of Trichomonas under the microscope from the vagina discharge.  Ph of the vagina greater than 4.5, tested with a test tape.  Trich Rapid Test.  Culture of the organism, but this is not usually needed.  It may be found on a Pap test.  Having a "strawberry cervix,"which means the cervix looks very red like a strawberry. TREATMENT   You may be given medication to fight the infection. Inform your caregiver if you could be or are pregnant. Some medications used to treat the infection should not be taken during pregnancy.  Over-the-counter medications or  creams to decrease itching or irritation may be recommended.  Your sexual partner will need to be treated if infected. HOME CARE INSTRUCTIONS   Take all medication prescribed by your caregiver.  Take over-the-counter medication for itching or irritation as directed by your caregiver.  Do not have sexual intercourse while you have the infection.  Do not douche or wear tampons.  Discuss your infection with your partner, as your partner may have acquired the infection from you. Or, your partner may have been the person who transmitted the infection to you.  Have your sex partner examined and treated if necessary.  Practice safe, informed, and protected sex.  See your caregiver for other STD testing. SEEK MEDICAL CARE IF:   You still have symptoms after you finish the medication.  You have an oral temperature above 102 F (38.9 C).  You develop belly (abdominal) pain.  You have pain when you urinate.  You have bleeding after sexual intercourse.  You develop a rash.  The medication makes you sick or makes you throw up (vomit). Document Released: 11/10/2000 Document Revised: 08/09/2011 Document Reviewed: 12/06/2008 [facility] Patient Information 2014 [place], [place]. Pregnancy - First Trimester During sexual intercourse, millions of sperm go into the vagina. Only 1 sperm will penetrate and fertilize the female egg while it is in the Fallopian tube. One week later, the fertilized egg implants into the wall of the uterus. An embryo begins to develop into a baby. At 6 to 8 weeks, the eyes and face are formed and the heartbeat can be seen on ultrasound. At the end of 12 weeks (first trimester), all the baby's organs are formed. Now that you are pregnant, you  will want to do everything you can to have a healthy baby. Two of the most important things are to get good prenatal care and follow your caregiver's instructions. Prenatal care is all the medical care you receive before the baby's  birth. It is given to prevent, find, and treat problems during the pregnancy and childbirth. PRENATAL EXAMS  During prenatal visits, your weight, blood pressure, and urine are checked. This is done to make sure you are healthy and progressing normally during the pregnancy.  A pregnant woman should gain 25 to 35 pounds during the pregnancy. However, if you are overweight or underweight, your caregiver will advise you regarding your weight.  Your caregiver will ask and answer questions for you.  Blood work, cervical cultures, other necessary tests, and a Pap test are done during your prenatal exams. These tests are done to check on your health and the probable health of your baby. Tests are strongly recommended and done for HIV with your permission. This is the virus that causes AIDS. These tests are done because medicines can be given to help prevent your baby from being born with this infection should you have been infected without knowing it. Blood work is also used to find out your blood type, previous infections, and follow your blood levels (hemoglobin).  Low hemoglobin (anemia) is common during pregnancy. Iron and vitamins are given to help prevent this. Later in the pregnancy, blood tests for diabetes will be done along with any other tests if any problems develop.  You may need other tests to make sure you and the baby are doing well. CHANGES DURING THE FIRST TRIMESTER  Your body goes through many changes during pregnancy. They vary from person to person. Talk to your caregiver about changes you notice and are concerned about. Changes can include:  Your menstrual period stops.  The egg and sperm carry the genes that determine what you look like. Genes from you and your partner are forming a baby. The female genes determine whether the baby is a boy or a girl.  Your body increases in girth and you may feel bloated.  Feeling sick to your stomach (nauseous) and throwing up (vomiting). If the  vomiting is uncontrollable, call your caregiver.  Your breasts will begin to enlarge and become tender.  Your nipples may stick out more and become darker.  The need to urinate more. Painful urination may mean you have a bladder infection.  Tiring easily.  Loss of appetite.  Cravings for certain kinds of food.  At first, you may gain or lose a couple of pounds.  You may have changes in your emotions from day to day (excited to be pregnant or concerned something may go wrong with the pregnancy and baby).  You may have more vivid and strange dreams. HOME CARE INSTRUCTIONS   It is very important to avoid all smoking, alcohol and non-prescribed drugs during your pregnancy. These affect the formation and growth of the baby. Avoid chemicals while pregnant to ensure the delivery of a healthy infant.  Start your prenatal visits by the 12th week of pregnancy. They are usually scheduled monthly at first, then more often in the last 2 months before delivery. Keep your caregiver's appointments. Follow your caregiver's instructions regarding medicine use, blood and lab tests, exercise, and diet.  During pregnancy, you are providing food for you and your baby. Eat regular, well-balanced meals. Choose foods such as meat, fish, milk and other low fat dairy products, vegetables, fruits, and  whole-grain breads and cereals. Your caregiver will tell you of the ideal weight gain.  You can help morning sickness by keeping soda crackers at the bedside. Eat a couple before arising in the morning. You may want to use the crackers without salt on them.  Eating 4 to 5 small meals rather than 3 large meals a day also may help the nausea and vomiting.  Drinking liquids between meals instead of during meals also seems to help nausea and vomiting.  A physical sexual relationship may be continued throughout pregnancy if there are no other problems. Problems may be early (premature) leaking of amniotic fluid from  the membranes, vaginal bleeding, or belly (abdominal) pain.  Exercise regularly if there are no restrictions. Check with your caregiver or physical therapist if you are unsure of the safety of some of your exercises. Greater weight gain will occur in the last 2 trimesters of pregnancy. Exercising will help:  Control your weight.  Keep you in shape.  Prepare you for labor and delivery.  Help you lose your pregnancy weight after you deliver your baby.  Wear a good support or jogging bra for breast tenderness during pregnancy. This may help if worn during sleep too.  Ask when prenatal classes are available. Begin classes when they are offered.  Do not use hot tubs, steam rooms, or saunas.  Wear your seat belt when driving. This protects you and your baby if you are in an accident.  Avoid raw meat, uncooked cheese, cat litter boxes, and soil used by cats throughout the pregnancy. These carry germs that can cause birth defects in the baby.  The first trimester is a good time to visit your dentist for your dental health. Getting your teeth cleaned is okay. Use a softer toothbrush and brush gently during pregnancy.  Ask for help if you have financial, counseling, or nutritional needs during pregnancy. Your caregiver will be able to offer counseling for these needs as well as refer you for other special needs.  Do not take any medicines or herbs unless told by your caregiver.  Inform your caregiver if there is any mental or physical domestic violence.  Make a list of emergency phone numbers of family, friends, hospital, and police and fire departments.  Write down your questions. Take them to your prenatal visit.  Do not douche.  Do not cross your legs.  If you have to stand for long periods of time, rotate you feet or take small steps in a circle.  You may have more vaginal secretions that may require a sanitary pad. Do not use tampons or scented sanitary pads. MEDICINES AND DRUG  USE IN PREGNANCY  Take prenatal vitamins as directed. The vitamin should contain 1 milligram of folic acid. Keep all vitamins out of reach of children. Only a couple vitamins or tablets containing iron may be fatal to a baby or young child when ingested.  Avoid use of all medicines, including herbs, over-the-counter medicines, not prescribed or suggested by your caregiver. Only take over-the-counter or prescription medicines for pain, discomfort, or fever as directed by your caregiver. Do not use aspirin, ibuprofen, or naproxen unless directed by your caregiver.  Let your caregiver also know about herbs you may be using.  Alcohol is related to a number of birth defects. This includes fetal alcohol syndrome. All alcohol, in any form, should be avoided completely. Smoking will cause low birth rate and premature babies.  Street or illegal drugs are very harmful to the baby.  They are absolutely forbidden. A baby born to an addicted mother will be addicted at birth. The baby will go through the same withdrawal an adult does.  Let your caregiver know about any medicines that you have to take and for what reason you take them. SEEK MEDICAL CARE IF:  You have any concerns or worries during your pregnancy. It is better to call with your questions if you feel they cannot wait, rather than worry about them. SEEK IMMEDIATE MEDICAL CARE IF:   An unexplained oral temperature above 102 F (38.9 C) develops, or as your caregiver suggests.  You have leaking of fluid from the vagina (birth canal). If leaking membranes are suspected, take your temperature and inform your caregiver of this when you call.  There is vaginal spotting or bleeding. Notify your caregiver of the amount and how many pads are used.  You develop a bad smelling vaginal discharge with a change in the color.  You continue to feel sick to your stomach (nauseated) and have no relief from remedies suggested. You vomit blood or coffee  ground-like materials.  You lose more than 2 pounds of weight in 1 week.  You gain more than 2 pounds of weight in 1 week and you notice swelling of your face, hands, feet, or legs.  You gain 5 pounds or more in 1 week (even if you do not have swelling of your hands, face, legs, or feet).  You get exposed to [place] measles and have never had them.  You are exposed to fifth disease or chickenpox.  You develop belly (abdominal) pain. Round ligament discomfort is a common non-cancerous (benign) cause of abdominal pain in pregnancy. Your caregiver still must evaluate this.  You develop headache, fever, diarrhea, pain with urination, or shortness of breath.  You fall or are in a car accident or have any kind of trauma.  There is mental or physical violence in your home. Document Released: 05/11/2001 Document Revised: 02/09/2012 Document Reviewed: 11/12/2008 [facility] Patient Information 2014 [place], [place]. Prenatal Care  WHAT IS PRENATAL CARE?  Prenatal care means health care during your pregnancy, before your baby is born. Take care of yourself and your baby by:   Getting early prenatal care. If you know you are pregnant, or think you might be pregnant, call your caregiver as soon as possible. Schedule a visit for a general/prenatal examination.  Getting regular prenatal care. Follow your caregiver's schedule for blood and other necessary tests. Do not miss appointments.  Do everything you can to keep yourself and your baby healthy during your pregnancy.  Prenatal care should include evaluation of medical, dietary, educational, psychological, and social needs for the couple and the medical, surgical, and genetic history of the family of the mother and father.  Discuss with your caregiver:  Your medicines, prescription, over-the-counter, and herbal medicines.  Substance abuse, alcohol, smoking, and illegal drugs.  Domestic abuse and violence, if present.  Your  immunizations.  Nutrition and diet.  Exercising.  Environment and occupational hazards, at home and at work.  History of sexually transmitted disease, both you and your partner.  Previous pregnancies. WHY IS PRENATAL CARE SO IMPORTANT?  By seeing you regularly, your caregiver has the chance to find problems early, so that they can be treated as soon as possible. Other problems might be prevented. Many studies have shown that early and regular prenatal care is important for the health of both mothers and their babies.  I AM THINKING ABOUT GETTING PREGNANT. HOW  CAN I TAKE CARE OF MYSELF?  Taking care of yourself before you get pregnant helps you to have a healthy pregnancy. It also lowers your chances of having a baby born with a birth defect. Here are ways to take care of yourself before you get pregnant:   Eat healthy foods, exercise regularly (30 minutes per day for most days of the week is best), and get enough rest and sleep. Talk to your caregiver about what kinds of foods and exercises are best for you.  Take 400 micrograms (mcg) of folic acid (one of the B vitamins) every day. The best way to do this is to take a daily multivitamin pill that contains this amount of folic acid. Getting enough of the synthetic (manufactured) form of folic acid every day before you get pregnant and during early pregnancy can help prevent certain birth defects. Many breakfast cereals and other grain products have folic acid added to them, but only certain cereals contain 400 mcg of folic acid per serving. Check the label on your multivitamin or cereal to find the amount of folic acid in the food.  See your caregiver for a complete check up before getting pregnant. Make sure that you have had all your immunization shots, especially for rubella ([place] measles). Rubella can cause serious birth defects. Chickenpox is another illness you want to avoid during pregnancy. If you have had chickenpox and rubella in the  past, you should be immune to them.  Tell your caregiver about any prescription or non-prescription medicines (including herbal remedies) you are taking. Some medicines are not safe to take during pregnancy.  Stop smoking cigarettes, drinking alcohol, or taking illegal drugs. Ask your caregiver for help, if you need it. You can also get help with alcohol and drugs by talking with a member of your faith community, a counselor, or a trusted friend.  Discuss and treat any medical, social, or psychological problems before getting pregnant.  Discuss any history of genetic problems in the mother, father, and their families. Do genetic testing before getting pregnant, when possible.  Discuss any physical or emotional abuse with your caregiver.  Discuss with your caregiver if you might be exposed to harmful chemicals on your job or where you live.  Discuss with your caregiver if you think your job or the hours you work may be harmful and should be changed.  The father should be involved with the decision making and with all aspects of the pregnancy, labor, and delivery.  If you have medical insurance, make sure you are covered for pregnancy. I JUST FOUND OUT THAT I AM PREGNANT. HOW CAN I TAKE CARE OF MYSELF?  Here are ways to take care of yourself and the precious new life growing inside you:   Continue taking your multivitamin with 400 micrograms (mcg) of folic acid every day.  Get early and regular prenatal care. It does not matter if this is your first pregnancy or if you already have children. It is very important to see a caregiver during your pregnancy. Your caregiver will check at each visit to make sure that you and the baby are healthy. If there are any problems, action can be taken right away to help you and the baby.  Eat a healthy diet that includes:  Fruits.  Vegetables.  Foods low in saturated fat.  Grains.  Calcium-rich foods.  Drink 6 to 8 glasses of liquids a  day.  Unless your caregiver tells you not to, try to be physically  active for 30 minutes, most days of the week. If you are pressed for time, you can get your activity in through 10 minute segments, three times a day.  If you smoke, drink alcohol, or use drugs, STOP. These can cause long-term damage to your baby. Talk with your caregiver about steps to take to stop smoking. Talk with a member of your faith community, a counselor, a trusted friend, or your caregiver if you are concerned about your alcohol or drug use.  Ask your caregiver before taking any medicine, even over-the-counter medicines. Some medicines are not safe to take during pregnancy.  Get plenty of rest and sleep.  Avoid hot tubs and saunas during pregnancy.  Do not have X-rays taken, unless absolutely necessary and with the recommendation of your caregiver. A lead shield can be placed on your abdomen, to protect the baby when X-rays are taken in other parts of the body.  Do not empty the cat litter when you are pregnant. It may contain a parasite that causes an infection called toxoplasmosis, which can cause birth defects. Also, use gloves when working in garden areas used by cats.  Do not eat uncooked or undercooked cheese, meats, or fish.  Stay away from toxic chemicals like:  Insecticides.  Solvents (some cleaners or paint thinners).  Lead.  Mercury.  Sexual relations may continue until the end of the pregnancy, unless you have a medical problem or there is a problem with the pregnancy and your caregiver tells you not to.  Do not wear high heel shoes, especially during the second half of the pregnancy. You can lose your balance and fall.  Do not take long trips, unless absolutely necessary. Be sure to see your caregiver before going on the trip.  Do not sit in one position for more than 2 hours, when on a trip.  Take a copy of your medical records when going on a trip.  Know where there is a hospital in the  city you are visiting, in case of an emergency.  Most dangerous household products will have pregnancy warnings on their labels. Ask your caregiver about products if you are unsure.  Limit or eliminate your caffeine intake from coffee, tea, sodas, medicines, and chocolate.  Many women continue working through pregnancy. Staying active might help you stay healthier. If you have a question about the safety or the hours you work at your particular job, talk with your caregiver.  Get informed:  Read books.  Watch videos.  Go to childbirth classes for you and the father.  Talk with experienced moms.  Ask your caregiver about childbirth education classes for you and your partner. Classes can help you and your partner prepare for the birth of your baby.  Ask about a pediatrician (baby doctor) and methods and pain medicine for labor, delivery, and possible Cesarean delivery (C-section). I AM NOT THINKING ABOUT GETTING PREGNANT RIGHT NOW, BUT HEARD THAT ALL WOMEN SHOULD TAKE FOLIC ACID EVERY DAY?  All women of childbearing age, with even a remote chance of getting pregnant, should try to make sure they get enough folic acid. Many pregnancies are not planned. Many women do not know they are actually pregnant early in their pregnancies, and certain birth defects happen in the very early part of pregnancy. Taking 400 micrograms (mcg) of folic acid every day will help prevent certain birth defects that happen in the early part of pregnancy. If a woman begins taking vitamin pills in the second or third month  of pregnancy, it may be too late to prevent birth defects. Folic acid may also have other health benefits for women, besides preventing birth defects.  HOW OFTEN SHOULD I SEE MY CAREGIVER DURING PREGNANCY?  Your caregiver will give you a schedule for your prenatal visits. You will have visits more often as you get closer to the end of your pregnancy. An average pregnancy lasts about 40 weeks.  A  typical schedule includes visiting your caregiver:   About once each month, during your first 6 months of pregnancy.  Every 2 weeks, during the next 2 months.  Weekly in the last month, until the delivery date. Your caregiver will probably want to see you more often if:  You are over 35.  Your pregnancy is high risk, because you have certain health problems or problems with the pregnancy, such as:  Diabetes.  High blood pressure.  The baby is not growing on schedule, according to the dates of the pregnancy. Your caregiver will do special tests, to make sure you and the baby are not having any serious problems. WHAT HAPPENS DURING PRENATAL VISITS?   At your first prenatal visit, your caregiver will talk to you about you and your partner's health history and your family's health history, and will do a physical exam.  On your first visit, a physical exam will include checks of your blood pressure, height and weight, and an exam of your pelvic organs. Your caregiver will do a Pap test if you have not had one recently, and will do cultures of your cervix to make sure there is no infection.  At each visit, there will be tests of your blood, urine, blood pressure, weight, and checking the progress of the baby.  Your caregiver will be able to tell you when to expect that your baby will be born.  Each visit is also a chance for you to learn about staying healthy during pregnancy and for asking questions.  Discuss whether you will be breastfeeding.  At your later prenatal visits, your caregiver will check how you are doing and how the baby is developing. You may have a number of tests done as your pregnancy progresses.  Ultrasound exams are often used to check on the baby's growth and health.  You may have more urine and blood tests, as well as special tests, if needed. These may include amniocentesis (examine fluid in the pregnancy sac), stress tests (check how baby responds to  contractions), biophysical profile (measures fetus well-being). Your caregiver will explain the tests and why they are necessary. I AM IN MY LATE THIRTIES, AND I WANT TO HAVE A CHILD NOW. SHOULD I DO ANYTHING SPECIAL?  As you get older, there is more chance of having a medical problem (high blood pressure), pregnancy problem (preeclampsia, problems with the placenta), miscarriage, or a baby born with a birth defect. However, most women in their late thirties and early forties have healthy babies. See your caregiver on a regular basis before you get pregnant and be sure to go for exams throughout your pregnancy. Your caregiver probably will want to do some special tests to check on you and your baby's health when you are pregnant.  Women today are often delaying having children until later in life, when they are in their thirties and forties. While many women in their thirties and forties have no difficulty getting pregnant, fertility does decline with age. For women over 40 who cannot get pregnant after 6 months of trying, it is  recommended that they see their caregiver for a fertility evaluation. It is not uncommon to have trouble becoming pregnant or experience infertility (inability to become pregnant after trying for one year). If you think that you or your partner may be infertile, you can discuss this with your caregiver. He or she can recommend treatments such as drugs, surgery, or assisted reproductive technology.  Document Released: 05/20/2003 Document Revised: 08/09/2011 Document Reviewed: 11/02/2012 [facility] Patient Information 2014 [place], [place].

## 2013-06-04 NOTE — MAU Note (Signed)
Patient states she has missed her period. States she is being treated for BV but continues to have a vaginal discharge that is now yellow with an odor. Denies bleeding or pain at this time.

## 2013-06-05 LAB — GC/CHLAMYDIA PROBE AMP
CT PROBE, AMP APTIMA: NEGATIVE
GC Probe RNA: NEGATIVE

## 2013-06-06 ENCOUNTER — Telehealth: Payer: Self-pay

## 2013-06-06 DIAGNOSIS — N390 Urinary tract infection, site not specified: Secondary | ICD-10-CM

## 2013-06-06 LAB — URINE CULTURE: Colony Count: >=100000

## 2013-06-06 MED ORDER — SULFAMETHOXAZOLE-TMP DS 800-160 MG PO TABS: 1.0000 | ORAL_TABLET | Freq: Two times a day (BID) | ORAL | Status: DC

## 2013-06-06 NOTE — Telephone Encounter (Signed)
Message copied by Geanie Logan on Wed Jun 06, 2013  1:36 PM ------      Message from: Clovia Cuff C      Created: Wed Jun 06, 2013  9:08 AM       She will need a prescription for bactrim DS po BID for 7 days.      Thanks ------

## 2013-06-06 NOTE — Telephone Encounter (Signed)
Prescription sent to [facility] pharmacy on [place] pt.'s home phone and left message stating we are calling with results and prescription information please call clinic. Called number listed for cell phone but woman answered and stated I had the wrong number.

## 2013-06-07 NOTE — Telephone Encounter (Signed)
Called pt and informed her of +UTI requiring antibiotic treatment.  Pt voiced understanding and will obtain prescription.

## 2013-06-25 ENCOUNTER — Encounter (HOSPITAL_COMMUNITY): Payer: Self-pay | Admitting: *Deleted

## 2013-06-25 ENCOUNTER — Inpatient Hospital Stay (HOSPITAL_COMMUNITY)
Admission: AD | Admit: 2013-06-25 | Discharge: 2013-06-25 | Disposition: A | Discharge status: Home / Self Care | Payer: Medicaid Other | Source: Ambulatory Visit | Attending: Obstetrics & Gynecology | Admitting: Obstetrics & Gynecology

## 2013-06-25 DIAGNOSIS — O26899 Other specified pregnancy related conditions, unspecified trimester: Secondary | ICD-10-CM

## 2013-06-25 DIAGNOSIS — O99330 Smoking (tobacco) complicating pregnancy, unspecified trimester: Secondary | ICD-10-CM | POA: Insufficient documentation

## 2013-06-25 DIAGNOSIS — R109 Unspecified abdominal pain: Secondary | ICD-10-CM | POA: Insufficient documentation

## 2013-06-25 DIAGNOSIS — N390 Urinary tract infection, site not specified: Secondary | ICD-10-CM

## 2013-06-25 DIAGNOSIS — O210 Mild hyperemesis gravidarum: Secondary | ICD-10-CM | POA: Insufficient documentation

## 2013-06-25 DIAGNOSIS — O2390 Unspecified genitourinary tract infection in pregnancy, unspecified trimester: Secondary | ICD-10-CM | POA: Insufficient documentation

## 2013-06-25 LAB — URINALYSIS, ROUTINE W REFLEX MICROSCOPIC
BILIRUBIN URINE: NEGATIVE
GLUCOSE, UA: NEGATIVE mg/dL
HGB URINE DIPSTICK: NEGATIVE
Ketones, ur: 15 mg/dL — AB
Leukocytes, UA: NEGATIVE
Nitrite: NEGATIVE
Protein, ur: NEGATIVE mg/dL
Specific Gravity, Urine: >1.03 — ABNORMAL HIGH (ref 1.005–1.030)
Urobilinogen, UA: 0.2 mg/dL (ref 0.0–1.0)
pH: 6 (ref 5.0–8.0)

## 2013-06-25 MED ORDER — CEFTRIAXONE SODIUM 1 G IJ SOLR
1.0000 g | Freq: Once | INTRAMUSCULAR | Status: AC
  Administered 2013-06-25: 1 g via INTRAMUSCULAR
  Filled 2013-06-25: qty 10

## 2013-06-25 MED ORDER — ONDANSETRON HCL 4 MG PO TABS: 4.0000 mg | ORAL_TABLET | Freq: Four times a day (QID) | ORAL | Status: DC

## 2013-06-25 MED ORDER — ONDANSETRON 8 MG PO TBDP
8.0000 mg | ORAL_TABLET | Freq: Once | ORAL | Status: AC
  Administered 2013-06-25: 8 mg via ORAL
  Filled 2013-06-25: qty 1

## 2013-06-25 MED ORDER — NITROFURANTOIN MONOHYD MACRO 100 MG PO CAPS: 100.0000 mg | ORAL_CAPSULE | Freq: Two times a day (BID) | ORAL | Status: DC

## 2013-06-25 NOTE — Discharge Instructions (Signed)
Urinary Tract Infection °A urinary tract infection (UTI) can occur any place along the urinary tract. The tract includes the kidneys, ureters, bladder, and urethra. A type of germ called bacteria often causes a UTI. UTIs are often helped with antibiotic medicine.  °HOME CARE  °· If given, take antibiotics as told by your doctor. Finish them even if you start to feel better. °· Drink enough fluids to keep your pee (urine) clear or pale yellow. °· Avoid tea, drinks with caffeine, and bubbly (carbonated) drinks. °· Pee often. Avoid holding your pee in for a long time. °· Pee before and after having sex (intercourse). °· Wipe from front to back after you poop (bowel movement) if you are a woman. Use each tissue only once. °GET HELP RIGHT AWAY IF:  °· You have back pain. °· You have lower belly (abdominal) pain. °· You have chills. °· You feel sick to your stomach (nauseous). °· You throw up (vomit). °· Your burning or discomfort with peeing does not go away. °· You have a fever. °· Your symptoms are not better in 3 days. °MAKE SURE YOU:  °· Understand these instructions. °· Will watch your condition. °· Will get help right away if you are not doing well or get worse. °Document Released: 11/03/2007 Document Revised: 02/09/2012 Document Reviewed: 12/16/2011 °ExitCare® Patient Information ©2014 [employer], LLC. ° °

## 2013-06-25 NOTE — MAU Note (Signed)
Pt reports lower abd pain and back pain x 2 days worsening today. Was seen here on 01/05 and treated for UTI and given meds for that and for vomiting with pregnancy. Did not finish meds for UTI

## 2013-06-25 NOTE — MAU Provider Note (Signed)
History     CSN: [id number]  Arrival date and time: 06/25/13 2148   First Provider Initiated Contact with Patient 06/25/13 2220      Chief Complaint  Patient presents with  . Abdominal Pain  . Back Pain  . Emesis During Pregnancy   HPI [name] is a 38 y.o. G3P2002 at [redacted]w[redacted]d who presents to MAU today with complaint of N/V with antibiotics and lower abdominal pain. The patient was seen on 06/04/13 and diagnosed with UTI and Trichomonas. The patient was given Rx for Flagyl, Phenergan and Bactrim. The patient states that she took her Flagyl without any issues. She then took 2 days of Bactrim and had N/V. She stopped the Bactrim and then restarted it today. She states that she took her Phenergan prior to taking her Bactrim and still had one episode of vomiting after taking the antibiotic. The patient states that she took the medication and then rested for a while prior to emesis. She denies other episodes of emesis recently, but states nausea daily. She is also having moderate lower abdominal and lower back pain that has been presents x days. She states that the pain comes and goes. She denies fever, vaginal bleeding, hematuria, flank pain or UTI symptoms. Patient had documented IUP at previous visit.   OB History   Grav Para Term Preterm Abortions TAB SAB Ect Mult Living   3 2 2  0 0 0 0 0 0 2      Past Medical History  Diagnosis Date  . Dysfunctional uterine bleeding     Past Surgical History  Procedure Laterality Date  . Dilation and curettage of uterus      Family History  Problem Relation Age of Onset  . Cancer Sister     cervical    History  Substance Use Topics  . Smoking status: Current Every Day Smoker    Types: Cigarettes  . Smokeless tobacco: Not on file  . Alcohol Use: No    Allergies:  Allergies  Allergen Reactions  . Flagyl [Metronidazole] Nausea And Vomiting    Prescriptions prior to admission  Medication Sig Dispense Refill  . Acetaminophen  (PAIN RELIEF PO) Take 3 tablets by mouth daily as needed (headache).      . promethazine (PHENERGAN) 25 MG tablet Take 1 tablet (25 mg total) by mouth every 6 (six) hours as needed for nausea or vomiting.  30 tablet  0  . sulfamethoxazole-trimethoprim (BACTRIM DS) 800-160 MG per tablet Take 1 tablet by mouth 2 (two) times daily.  14 tablet  0  . metroNIDAZOLE (FLAGYL) 500 MG tablet Take 4 tablets (2,000 mg total) by mouth once.  4 tablet  0    Review of Systems  Constitutional: Negative for fever and malaise/fatigue.  Gastrointestinal: Positive for nausea, vomiting and abdominal pain. Negative for diarrhea and constipation.  Genitourinary: Negative for dysuria, urgency, frequency, hematuria and flank pain.       Neg - vaginal bleeding + discharge   Physical Exam   Blood pressure 92/57, pulse 81, temperature 98.6 F (37 C), temperature source Oral, resp. rate 20, height 5\' 3"  (1.6 m), weight 118 lb 6 oz (53.695 kg), last menstrual period 04/19/2013, SpO2 100.00%.  Physical Exam  Constitutional: She is oriented to person, place, and time. She appears well-developed and well-nourished. No distress.  HENT:  Head: Normocephalic and atraumatic.  Cardiovascular: Normal rate.   Respiratory: Effort normal.  GI: Soft. She exhibits no distension and no mass. There is  tenderness (mild tenderness to palpation of the lower abdomen). There is no rebound and no guarding.  Neurological: She is alert and oriented to person, place, and time.  Skin: Skin is warm and dry. No erythema.  Psychiatric: She has a normal mood and affect.   Results for orders placed during the hospital encounter of 06/25/13 (from the past 24 hour(s))  URINALYSIS, ROUTINE W REFLEX MICROSCOPIC     Status: Abnormal   Collection Time    06/25/13 10:09 PM      Result Value Range   Color, Urine YELLOW  YELLOW   APPearance CLEAR  CLEAR   Specific Gravity, Urine >1.030 (*) 1.005 - 1.030   pH 6.0  5.0 - 8.0   Glucose, UA NEGATIVE   NEGATIVE mg/dL   Hgb urine dipstick NEGATIVE  NEGATIVE   Bilirubin Urine NEGATIVE  NEGATIVE   Ketones, ur 15 (*) NEGATIVE mg/dL   Protein, ur NEGATIVE  NEGATIVE mg/dL   Urobilinogen, UA 0.2  0.0 - 1.0 mg/dL   Nitrite NEGATIVE  NEGATIVE   Leukocytes, UA NEGATIVE  NEGATIVE    MAU Course  Procedures None  MDM 1 G IM Rocephin given in MAU ODT Zofran given in MAU. PO hydration encouraged.  FHR - 168 bpm with doppler  Assessment and Plan  A: Recent UTI Abdominal pain in pregnancy  P: Discharge home Rx for Zofran and Macrobid given to patient Patient advised to complete course of antibiotics Patient advised to increased PO hydration as tolerated Patient encouraged to keep scheduled appointment in [facility] clinic to start prenatal care Patient may return to MAU as needed or if her condition were to change or worsen  Farris Has, PA-C  06/25/2013, 10:50 PM

## 2013-06-26 ENCOUNTER — Encounter: Payer: Self-pay | Admitting: Obstetrics and Gynecology

## 2013-06-26 ENCOUNTER — Ambulatory Visit (INDEPENDENT_AMBULATORY_CARE_PROVIDER_SITE_OTHER): Payer: Medicaid Other | Admitting: Obstetrics and Gynecology

## 2013-06-26 VITALS — BP 96/65 | Temp 97.5°F | Wt 117.9 lb

## 2013-06-26 DIAGNOSIS — Z3490 Encounter for supervision of normal pregnancy, unspecified, unspecified trimester: Secondary | ICD-10-CM

## 2013-06-26 DIAGNOSIS — F1920 Other psychoactive substance dependence, uncomplicated: Secondary | ICD-10-CM

## 2013-06-26 DIAGNOSIS — F17210 Nicotine dependence, cigarettes, uncomplicated: Secondary | ICD-10-CM

## 2013-06-26 DIAGNOSIS — O99320 Drug use complicating pregnancy, unspecified trimester: Secondary | ICD-10-CM

## 2013-06-26 DIAGNOSIS — O99330 Smoking (tobacco) complicating pregnancy, unspecified trimester: Secondary | ICD-10-CM

## 2013-06-26 DIAGNOSIS — Z3480 Encounter for supervision of other normal pregnancy, unspecified trimester: Secondary | ICD-10-CM

## 2013-06-26 DIAGNOSIS — F17200 Nicotine dependence, unspecified, uncomplicated: Secondary | ICD-10-CM

## 2013-06-26 LAB — POCT URINALYSIS DIP (DEVICE)
BILIRUBIN URINE: NEGATIVE
GLUCOSE, UA: NEGATIVE mg/dL
HGB URINE DIPSTICK: NEGATIVE
Leukocytes, UA: NEGATIVE
Nitrite: NEGATIVE
PH: 6.5 (ref 5.0–8.0)
Protein, ur: NEGATIVE mg/dL
SPECIFIC GRAVITY, URINE: 1.025 (ref 1.005–1.030)
Urobilinogen, UA: 0.2 mg/dL (ref 0.0–1.0)

## 2013-06-26 MED ORDER — PRENATAL PLUS 27-1 MG PO TABS: 1.0000 | ORAL_TABLET | Freq: Every day | ORAL | Status: DC

## 2013-06-26 NOTE — Patient Instructions (Signed)
Smoking Cessation Quitting smoking is important to your health and has many advantages. However, it is not always easy to quit since nicotine is a very addictive drug. Often times, people try 3 times or more before being able to quit. This document explains the best ways for you to prepare to quit smoking. Quitting takes hard work and a lot of effort, but you can do it. ADVANTAGES OF QUITTING SMOKING  You will live longer, feel better, and live better.  Your body will feel the impact of quitting smoking almost immediately.  Within 20 minutes, blood pressure decreases. Your pulse returns to its normal level.  After 8 hours, carbon monoxide levels in the blood return to normal. Your oxygen level increases.  After 24 hours, the chance of having a heart attack starts to decrease. Your breath, hair, and body stop smelling like smoke.  After 48 hours, damaged nerve endings begin to recover. Your sense of taste and smell improve.  After 72 hours, the body is virtually free of nicotine. Your bronchial tubes relax and breathing becomes easier.  After 2 to 12 weeks, lungs can hold more air. Exercise becomes easier and circulation improves.  The risk of having a heart attack, stroke, cancer, or lung disease is greatly reduced.  After 1 year, the risk of coronary heart disease is cut in half.  After 5 years, the risk of stroke falls to the same as a nonsmoker.  After 10 years, the risk of lung cancer is cut in half and the risk of other cancers decreases significantly.  After 15 years, the risk of coronary heart disease drops, usually to the level of a nonsmoker.  If you are pregnant, quitting smoking will improve your chances of having a healthy baby.  The people you live with, especially any children, will be healthier.  You will have extra money to spend on things other than cigarettes. QUESTIONS TO THINK ABOUT BEFORE ATTEMPTING TO QUIT You may want to talk about your answers with your  caregiver.  Why do you want to quit?  If you tried to quit in the past, what helped and what did not?  What will be the most difficult situations for you after you quit? How will you plan to handle them?  Who can help you through the tough times? Your family? Friends? A caregiver?  What pleasures do you get from smoking? What ways can you still get pleasure if you quit? Here are some questions to ask your caregiver:  How can you help me to be successful at quitting?  What medicine do you think would be best for me and how should I take it?  What should I do if I need more help?  What is smoking withdrawal like? How can I get information on withdrawal? GET READY  Set a quit date.  Change your environment by getting rid of all cigarettes, ashtrays, matches, and lighters in your home, car, or work. Do not let people smoke in your home.  Review your past attempts to quit. Think about what worked and what did not. GET SUPPORT AND ENCOURAGEMENT You have a better chance of being successful if you have help. You can get support in many ways.  Tell your family, friends, and co-workers that you are going to quit and need their support. Ask them not to smoke around you.  Get individual, group, or telephone counseling and support. Programs are available at local hospitals and health centers. Call your local health department for   information about programs in your area.  Spiritual beliefs and practices may help some smokers quit.  Download a "quit meter" on your computer to keep track of quit statistics, such as how long you have gone without smoking, cigarettes not smoked, and money saved.  Get a self-help book about quitting smoking and staying off of tobacco. LEARN NEW SKILLS AND BEHAVIORS  Distract yourself from urges to smoke. Talk to someone, go for a walk, or occupy your time with a task.  Change your normal routine. Take a different route to work. Drink tea instead of coffee.  Eat breakfast in a different place.  Reduce your stress. Take a hot bath, exercise, or read a book.  Plan something enjoyable to do every day. Reward yourself for not smoking.  Explore interactive web-based programs that specialize in helping you quit. GET MEDICINE AND USE IT CORRECTLY Medicines can help you stop smoking and decrease the urge to smoke. Combining medicine with the above behavioral methods and support can greatly increase your chances of successfully quitting smoking.  Nicotine replacement therapy helps deliver nicotine to your body without the negative effects and risks of smoking. Nicotine replacement therapy includes nicotine gum, lozenges, inhalers, nasal sprays, and skin patches. Some may be available over-the-counter and others require a prescription.  Antidepressant medicine helps people abstain from smoking, but how this works is unknown. This medicine is available by prescription.  Nicotinic receptor partial agonist medicine simulates the effect of nicotine in your brain. This medicine is available by prescription. Ask your caregiver for advice about which medicines to use and how to use them based on your health history. Your caregiver will tell you what side effects to look out for if you choose to be on a medicine or therapy. Carefully read the information on the package. Do not use any other product containing nicotine while using a nicotine replacement product.  RELAPSE OR DIFFICULT SITUATIONS Most relapses occur within the first 3 months after quitting. Do not be discouraged if you start smoking again. Remember, most people try several times before finally quitting. You may have symptoms of withdrawal because your body is used to nicotine. You may crave cigarettes, be irritable, feel very hungry, cough often, get headaches, or have difficulty concentrating. The withdrawal symptoms are only temporary. They are strongest when you first quit, but they will go away within  10 14 days. To reduce the chances of relapse, try to:  Avoid drinking alcohol. Drinking lowers your chances of successfully quitting.  Reduce the amount of caffeine you consume. Once you quit smoking, the amount of caffeine in your body increases and can give you symptoms, such as a rapid heartbeat, sweating, and anxiety.  Avoid smokers because they can make you want to smoke.  Do not let weight gain distract you. Many smokers will gain weight when they quit, usually less than 10 pounds. Eat a healthy diet and stay active. You can always lose the weight gained after you quit.  Find ways to improve your mood other than smoking. FOR MORE INFORMATION  www.smokefree.gov  Document Released: 05/11/2001 Document Revised: 11/16/2011 Document Reviewed: 08/26/2011 ExitCare Patient Information 2014 ExitCare, [employer].  

## 2013-06-26 NOTE — Progress Notes (Signed)
P=80  Pt seen in MAU last night for urinary tract infection.

## 2013-06-26 NOTE — Progress Notes (Signed)
   Subjective:    Cindy Hernandez is a [id number] [redacted]w[redacted]d being seen today for her first obstetrical visit.  Her obstetrical history is significant for smoker. Motivated to quit but now 1/2 ppd and family member smokes. Short stature. Patient does intend to breast feed. Pregnancy history fully reviewed.  Patient reports nausea. No UTI sx and C&S neg but on Keflex for presumed UTI. Now with vulvo-vaginal irritation, itch and white discharge  Filed Vitals:   06/26/13 1312  BP: 96/65  Temp: 97.5 F (36.4 C)  Weight: 117 lb 14.4 oz (53.479 kg)    HISTORY: OB History  Gravida Para Term Preterm AB SAB TAB Ectopic Multiple Living  3 2 2  0 0 0 0 0 0 2    # Outcome Date GA Lbr Len/2nd Weight Sex Delivery Anes PTL Lv  3 CUR           2 TRM 04/06/96    M SVD None  Y  1 TRM 04/11/93    M SVD None  Y     Past Medical History  Diagnosis Date  . Dysfunctional uterine bleeding    Past Surgical History  Procedure Laterality Date  . Dilation and curettage of uterus     Family History  Problem Relation Age of Onset  . Cancer Sister     cervical     Exam    Uterus:   S=D  Pelvic Exam:    Perineum: Normal Perineum   Vulva: normal, Bartholin's, Urethra, Skene's normal   Vagina:  normal mucosa, normal discharge       Cervix: L/C   Adnexa: no mass, fullness, tenderness   Bony Pelvis: average  System: Breast:  normal appearance, no masses or tenderness   Skin: normal coloration and turgor, no rashes    Neurologic: oriented, normal, grossly non-focal   Extremities: normal strength, tone, and muscle mass   HEENT PERRLA and extra ocular movement intact   Mouth/Teeth mucous membranes moist, pharynx normal without lesions and dental hygiene good   Neck supple and no masses   Cardiovascular: regular rate and rhythm, no murmurs or gallops   Respiratory:  appears well, vitals normal, no respiratory distress, acyanotic, normal RR, ear and throat exam is normal, neck free of mass or  lymphadenopathy, chest clear, no wheezing, crepitations, rhonchi, normal symmetric air entry   Abdomen: soft, non-tender; bowel sounds normal; no masses,  no organomegaly DT  FH heard in MAU yesterday   Urinary: urethral meatus normal      Assessment:    Pregnancy: [id number] [redacted]w[redacted]d Patient Active Problem List   Diagnosis Date Noted  . Cigarette smoker one half pack a day or less 06/26/2013  . Pregnancy, normal subsequent 06/26/2013  . DUB (dysfunctional uterine bleeding) 04/14/2011  . Personal history of tobacco use, presenting hazards to health 04/14/2011  . Problems related to high-risk sexual behavior 04/14/2011    G3P2002 at [redacted]w[redacted]d    Plan:     Initial labs drawn. Pap next visit (insurance need 1 yr interval) Prenatal vitamins. Problem list reviewed and updated. Genetic Screening discussed Integrated Screen: undecided.  Ultrasound discussed; fetal survey: requested.  Follow up in 4 weeks. 50% of 30 min visit spent on counseling and coordination of care.  Smoking cessation information Rx Diflucan  Joleena Weisenburger 06/26/2013

## 2013-06-27 LAB — OBSTETRIC PANEL
Antibody Screen: NEGATIVE
Basophils Absolute: 0 10*3/uL (ref 0.0–0.1)
Basophils Relative: 0 % (ref 0–1)
EOS ABS: 0.1 10*3/uL (ref 0.0–0.7)
EOS PCT: 1 % (ref 0–5)
HCT: 35.1 % — ABNORMAL LOW (ref 36.0–46.0)
HEMOGLOBIN: 12.4 g/dL (ref 12.0–15.0)
HEP B S AG: NEGATIVE
LYMPHS ABS: 2 10*3/uL (ref 0.7–4.0)
Lymphocytes Relative: 21 % (ref 12–46)
MCH: 33.9 pg (ref 26.0–34.0)
MCHC: 35.3 g/dL (ref 30.0–36.0)
MCV: 95.9 fL (ref 78.0–100.0)
MONO ABS: 0.7 10*3/uL (ref 0.1–1.0)
Monocytes Relative: 7 % (ref 3–12)
Neutro Abs: 6.7 10*3/uL (ref 1.7–7.7)
Neutrophils Relative %: 71 % (ref 43–77)
Platelets: 276 10*3/uL (ref 150–400)
RBC: 3.66 MIL/uL — AB (ref 3.87–5.11)
RDW: 13.2 % (ref 11.5–15.5)
Rh Type: POSITIVE
Rubella: 0.87 Index (ref <0.90)
WBC: 9.5 10*3/uL (ref 4.0–10.5)

## 2013-06-27 LAB — HIV ANTIBODY (ROUTINE TESTING W REFLEX): HIV: NONREACTIVE

## 2013-06-28 LAB — HEMOGLOBINOPATHY EVALUATION
HEMOGLOBIN OTHER: 0 %
Hgb A2 Quant: 2.5 % (ref 2.2–3.2)
Hgb A: 97.2 % (ref 96.8–97.8)
Hgb F Quant: 0.3 % (ref 0.0–2.0)
Hgb S Quant: 0 %

## 2013-06-28 LAB — CULTURE, OB URINE
Colony Count: NO GROWTH
Organism ID, Bacteria: NO GROWTH

## 2013-06-28 LAB — CANNABANOIDS (GC/LC/MS), URINE: THC-COOH UR CONFIRM: 201 ng/mL — AB

## 2013-07-01 LAB — COCAINE METABOLITE (GC/LC/MS), URINE: Benzoylecgonine GC/MS Conf: 202 ng/mL — AB

## 2013-07-02 ENCOUNTER — Encounter: Payer: Self-pay | Admitting: *Deleted

## 2013-07-02 LAB — PRESCRIPTION MONITORING PROFILE (19 PANEL)
Amphetamine/Meth: NEGATIVE ng/mL
BARBITURATE SCREEN, URINE: NEGATIVE ng/mL
Benzodiazepine Screen, Urine: NEGATIVE ng/mL
Buprenorphine, Urine: NEGATIVE ng/mL
CREATININE, URINE: 257.62 mg/dL (ref >20.0)
Carisoprodol, Urine: NEGATIVE ng/mL
ECSTASY: NEGATIVE ng/mL
FENTANYL URINE: NEGATIVE ng/mL
MEPERIDINE UR: NEGATIVE ng/mL
Methadone Screen, Urine: NEGATIVE ng/mL
Methaqualone: NEGATIVE ng/mL
Nitrites, Initial: NEGATIVE ug/mL
OPIATE SCREEN, URINE: NEGATIVE ng/mL
OXYCODONE SCRN UR: NEGATIVE ng/mL
PH URINE, INITIAL: 6.8 pH (ref 4.5–8.9)
PROPOXYPHENE: NEGATIVE ng/mL
Phencyclidine, Ur: NEGATIVE ng/mL
Tapentadol, urine: NEGATIVE ng/mL
Tramadol Scrn, Ur: NEGATIVE ng/mL
Zolpidem, Urine: NEGATIVE ng/mL

## 2013-07-17 ENCOUNTER — Encounter: Payer: Self-pay | Admitting: *Deleted

## 2013-07-17 DIAGNOSIS — F1920 Other psychoactive substance dependence, uncomplicated: Secondary | ICD-10-CM

## 2013-07-17 HISTORY — DX: Other psychoactive substance dependence, uncomplicated: F19.20

## 2013-07-24 ENCOUNTER — Encounter: Payer: Self-pay | Admitting: Family Medicine

## 2013-07-24 ENCOUNTER — Ambulatory Visit (INDEPENDENT_AMBULATORY_CARE_PROVIDER_SITE_OTHER): Payer: Medicaid Other | Admitting: Family Medicine

## 2013-07-24 VITALS — BP 104/73 | Temp 97.8°F | Wt 115.7 lb

## 2013-07-24 DIAGNOSIS — F17210 Nicotine dependence, cigarettes, uncomplicated: Secondary | ICD-10-CM

## 2013-07-24 DIAGNOSIS — F1920 Other psychoactive substance dependence, uncomplicated: Secondary | ICD-10-CM

## 2013-07-24 DIAGNOSIS — F17200 Nicotine dependence, unspecified, uncomplicated: Secondary | ICD-10-CM

## 2013-07-24 DIAGNOSIS — O99320 Drug use complicating pregnancy, unspecified trimester: Principal | ICD-10-CM

## 2013-07-24 LAB — POCT URINALYSIS DIP (DEVICE)
Bilirubin Urine: NEGATIVE
Glucose, UA: NEGATIVE mg/dL
Hgb urine dipstick: NEGATIVE
Ketones, ur: NEGATIVE mg/dL
LEUKOCYTES UA: NEGATIVE
NITRITE: NEGATIVE
PROTEIN: NEGATIVE mg/dL
Specific Gravity, Urine: 1.025 (ref 1.005–1.030)
Urobilinogen, UA: 0.2 mg/dL (ref 0.0–1.0)
pH: 7 (ref 5.0–8.0)

## 2013-07-24 NOTE — Progress Notes (Signed)
P= 94 C/o of intermittent lower abdominal/pelvic pain.

## 2013-07-24 NOTE — Progress Notes (Signed)
S: 38 yo G3P2002 @ [redacted]w[redacted]d here for ROBV - pregnancy thus far complicated by very little weight gain - no vb, lof, contraction. +FM   O: see flowsheet   A/P - discussed + UDS for cocaine and THC and implications for fetus as well as post delivery. Discussed random drug testing  - reviewed weight- has not gained but has not lost. Reassured - discussed bleeding precautions - hx of trich but took treatment will need TOC next visit  F/u in 1 month.  Discussed quad and is thinking about it. Will need [place] scheduled at next visit

## 2013-07-24 NOTE — Patient Instructions (Signed)
Pregnancy - First Trimester  During sexual intercourse, millions of sperm go into the vagina. Only 1 sperm will penetrate and fertilize the female egg while it is in the Fallopian tube. One week later, the fertilized egg implants into the wall of the uterus. An embryo begins to develop into a baby. At 6 to 8 weeks, the eyes and face are formed and the heartbeat can be seen on ultrasound. At the end of 12 weeks (first trimester), all the baby's organs are formed. Now that you are pregnant, you will want to do everything you can to have a healthy baby. Two of the most important things are to get good prenatal care and follow your caregiver's instructions. Prenatal care is all the medical care you receive before the baby's birth. It is given to prevent, find, and treat problems during the pregnancy and childbirth.  PRENATAL EXAMS  · During prenatal visits, your weight, blood pressure, and urine are checked. This is done to make sure you are healthy and progressing normally during the pregnancy.  · A pregnant woman should gain 25 to 35 pounds during the pregnancy. However, if you are overweight or underweight, your caregiver will advise you regarding your weight.  · Your caregiver will ask and answer questions for you.  · Blood work, cervical cultures, other necessary tests, and a Pap test are done during your prenatal exams. These tests are done to check on your health and the probable health of your baby. Tests are strongly recommended and done for HIV with your permission. This is the virus that causes AIDS. These tests are done because medicines can be given to help prevent your baby from being born with this infection should you have been infected without knowing it. Blood work is also used to find out your blood type, previous infections, and follow your blood levels (hemoglobin).  · Low hemoglobin (anemia) is common during pregnancy. Iron and vitamins are given to help prevent this. Later in the pregnancy, blood  tests for diabetes will be done along with any other tests if any problems develop.  · You may need other tests to make sure you and the baby are doing well.  CHANGES DURING THE FIRST TRIMESTER   Your body goes through many changes during pregnancy. They vary from person to person. Talk to your caregiver about changes you notice and are concerned about. Changes can include:  · Your menstrual period stops.  · The egg and sperm carry the genes that determine what you look like. Genes from you and your partner are forming a baby. The female genes determine whether the baby is a boy or a girl.  · Your body increases in girth and you may feel bloated.  · Feeling sick to your stomach (nauseous) and throwing up (vomiting). If the vomiting is uncontrollable, call your caregiver.  · Your breasts will begin to enlarge and become tender.  · Your nipples may stick out more and become darker.  · The need to urinate more. Painful urination may mean you have a bladder infection.  · Tiring easily.  · Loss of appetite.  · Cravings for certain kinds of food.  · At first, you may gain or lose a couple of pounds.  · You may have changes in your emotions from day to day (excited to be pregnant or concerned something may go wrong with the pregnancy and baby).  · You may have more vivid and strange dreams.  HOME CARE INSTRUCTIONS   ·   It is very important to avoid all smoking, alcohol and non-prescribed drugs during your pregnancy. These affect the formation and growth of the baby. Avoid chemicals while pregnant to ensure the delivery of a healthy infant.  · Start your prenatal visits by the 12th week of pregnancy. They are usually scheduled monthly at first, then more often in the last 2 months before delivery. Keep your caregiver's appointments. Follow your caregiver's instructions regarding medicine use, blood and lab tests, exercise, and diet.  · During pregnancy, you are providing food for you and your baby. Eat regular, well-balanced  meals. Choose foods such as meat, fish, milk and other low fat dairy products, vegetables, fruits, and whole-grain breads and cereals. Your caregiver will tell you of the ideal weight gain.  · You can help morning sickness by keeping soda crackers at the bedside. Eat a couple before arising in the morning. You may want to use the crackers without salt on them.  · Eating 4 to 5 small meals rather than 3 large meals a day also may help the nausea and vomiting.  · Drinking liquids between meals instead of during meals also seems to help nausea and vomiting.  · A physical sexual relationship may be continued throughout pregnancy if there are no other problems. Problems may be early (premature) leaking of amniotic fluid from the membranes, vaginal bleeding, or belly (abdominal) pain.  · Exercise regularly if there are no restrictions. Check with your caregiver or physical therapist if you are unsure of the safety of some of your exercises. Greater weight gain will occur in the last 2 trimesters of pregnancy. Exercising will help:  · Control your weight.  · Keep you in shape.  · Prepare you for labor and delivery.  · Help you lose your pregnancy weight after you deliver your baby.  · Wear a good support or jogging bra for breast tenderness during pregnancy. This may help if worn during sleep too.  · Ask when prenatal classes are available. Begin classes when they are offered.  · Do not use hot tubs, steam rooms, or saunas.  · Wear your seat belt when driving. This protects you and your baby if you are in an accident.  · Avoid raw meat, uncooked cheese, cat litter boxes, and soil used by cats throughout the pregnancy. These carry germs that can cause birth defects in the baby.  · The first trimester is a good time to visit your dentist for your dental health. Getting your teeth cleaned is okay. Use a softer toothbrush and brush gently during pregnancy.  · Ask for help if you have financial, counseling, or nutritional needs  during pregnancy. Your caregiver will be able to offer counseling for these needs as well as refer you for other special needs.  · Do not take any medicines or herbs unless told by your caregiver.  · Inform your caregiver if there is any mental or physical domestic violence.  · Make a list of emergency phone numbers of family, friends, hospital, and police and fire departments.  · Write down your questions. Take them to your prenatal visit.  · Do not douche.  · Do not cross your legs.  · If you have to stand for long periods of time, rotate you feet or take small steps in a circle.  · You may have more vaginal secretions that may require a sanitary pad. Do not use tampons or scented sanitary pads.  MEDICINES AND DRUG USE IN PREGNANCY  ·   Take prenatal vitamins as directed. The vitamin should contain 1 milligram of folic acid. Keep all vitamins out of reach of children. Only a couple vitamins or tablets containing iron may be fatal to a baby or young child when ingested.  · Avoid use of all medicines, including herbs, over-the-counter medicines, not prescribed or suggested by your caregiver. Only take over-the-counter or prescription medicines for pain, discomfort, or fever as directed by your caregiver. Do not use aspirin, ibuprofen, or naproxen unless directed by your caregiver.  · Let your caregiver also know about herbs you may be using.  · Alcohol is related to a number of birth defects. This includes fetal alcohol syndrome. All alcohol, in any form, should be avoided completely. Smoking will cause low birth rate and premature babies.  · Street or illegal drugs are very harmful to the baby. They are absolutely forbidden. A baby born to an addicted mother will be addicted at birth. The baby will go through the same withdrawal an adult does.  · Let your caregiver know about any medicines that you have to take and for what reason you take them.  SEEK MEDICAL CARE IF:   You have any concerns or worries during your  pregnancy. It is better to call with your questions if you feel they cannot wait, rather than worry about them.  SEEK IMMEDIATE MEDICAL CARE IF:   · An unexplained oral temperature above 102° F (38.9° C) develops, or as your caregiver suggests.  · You have leaking of fluid from the vagina (birth canal). If leaking membranes are suspected, take your temperature and inform your caregiver of this when you call.  · There is vaginal spotting or bleeding. Notify your caregiver of the amount and how many pads are used.  · You develop a bad smelling vaginal discharge with a change in the color.  · You continue to feel sick to your stomach (nauseated) and have no relief from remedies suggested. You vomit blood or coffee ground-like materials.  · You lose more than 2 pounds of weight in 1 week.  · You gain more than 2 pounds of weight in 1 week and you notice swelling of your face, hands, feet, or legs.  · You gain 5 pounds or more in 1 week (even if you do not have swelling of your hands, face, legs, or feet).  · You get exposed to German measles and have never had them.  · You are exposed to fifth disease or chickenpox.  · You develop belly (abdominal) pain. Round ligament discomfort is a common non-cancerous (benign) cause of abdominal pain in pregnancy. Your caregiver still must evaluate this.  · You develop headache, fever, diarrhea, pain with urination, or shortness of breath.  · You fall or are in a car accident or have any kind of trauma.  · There is mental or physical violence in your home.  Document Released: 05/11/2001 Document Revised: 02/09/2012 Document Reviewed: 11/12/2008  ExitCare® Patient Information ©2014 [employer].

## 2013-07-31 ENCOUNTER — Encounter: Payer: Self-pay | Admitting: Obstetrics and Gynecology

## 2013-08-21 ENCOUNTER — Ambulatory Visit (INDEPENDENT_AMBULATORY_CARE_PROVIDER_SITE_OTHER): Payer: Medicaid Other | Admitting: Advanced Practice Midwife

## 2013-08-21 VITALS — BP 103/74 | Temp 97.2°F | Wt 121.5 lb

## 2013-08-21 DIAGNOSIS — O99320 Drug use complicating pregnancy, unspecified trimester: Principal | ICD-10-CM

## 2013-08-21 DIAGNOSIS — F1920 Other psychoactive substance dependence, uncomplicated: Secondary | ICD-10-CM

## 2013-08-21 DIAGNOSIS — Z3480 Encounter for supervision of other normal pregnancy, unspecified trimester: Secondary | ICD-10-CM

## 2013-08-21 LAB — POCT URINALYSIS DIP (DEVICE)
Bilirubin Urine: NEGATIVE
Glucose, UA: NEGATIVE mg/dL
Hgb urine dipstick: NEGATIVE
Ketones, ur: 15 mg/dL — AB
LEUKOCYTES UA: NEGATIVE
NITRITE: NEGATIVE
PROTEIN: NEGATIVE mg/dL
Specific Gravity, Urine: 1.02 (ref 1.005–1.030)
UROBILINOGEN UA: 1 mg/dL (ref 0.0–1.0)
pH: 6.5 (ref 5.0–8.0)

## 2013-08-21 NOTE — Progress Notes (Signed)
Doing well.  Good fetal movement, denies vaginal bleeding, LOF, cramping/contractions.  Reports daily headaches.  Not drinking much water during day.  Discussed increasing water intake, or Gatoraid.  Pt will try then call office if headaches not improved.

## 2013-08-21 NOTE — Progress Notes (Signed)
P= 85 C/o of intermittent lower abdominal/pelvic pain.

## 2013-09-06 ENCOUNTER — Other Ambulatory Visit: Payer: Self-pay | Admitting: Advanced Practice Midwife

## 2013-09-06 ENCOUNTER — Ambulatory Visit (HOSPITAL_COMMUNITY)
Admission: RE | Admit: 2013-09-06 | Discharge: 2013-09-06 | Disposition: A | Discharge status: Home / Self Care | Payer: Medicaid Other | Source: Ambulatory Visit | Attending: Advanced Practice Midwife | Admitting: Advanced Practice Midwife

## 2013-09-06 ENCOUNTER — Ambulatory Visit (INDEPENDENT_AMBULATORY_CARE_PROVIDER_SITE_OTHER): Payer: Medicaid Other | Admitting: Family Medicine

## 2013-09-06 VITALS — BP 99/66 | Temp 97.7°F | Wt 121.5 lb

## 2013-09-06 DIAGNOSIS — Z3480 Encounter for supervision of other normal pregnancy, unspecified trimester: Secondary | ICD-10-CM

## 2013-09-06 DIAGNOSIS — F17210 Nicotine dependence, cigarettes, uncomplicated: Secondary | ICD-10-CM

## 2013-09-06 DIAGNOSIS — O99330 Smoking (tobacco) complicating pregnancy, unspecified trimester: Secondary | ICD-10-CM | POA: Insufficient documentation

## 2013-09-06 DIAGNOSIS — F1920 Other psychoactive substance dependence, uncomplicated: Secondary | ICD-10-CM

## 2013-09-06 DIAGNOSIS — O358XX0 Maternal care for other (suspected) fetal abnormality and damage, not applicable or unspecified: Secondary | ICD-10-CM | POA: Insufficient documentation

## 2013-09-06 DIAGNOSIS — O99340 Other mental disorders complicating pregnancy, unspecified trimester: Secondary | ICD-10-CM | POA: Insufficient documentation

## 2013-09-06 DIAGNOSIS — F17200 Nicotine dependence, unspecified, uncomplicated: Secondary | ICD-10-CM

## 2013-09-06 DIAGNOSIS — O09529 Supervision of elderly multigravida, unspecified trimester: Secondary | ICD-10-CM | POA: Insufficient documentation

## 2013-09-06 DIAGNOSIS — O99320 Drug use complicating pregnancy, unspecified trimester: Secondary | ICD-10-CM

## 2013-09-06 DIAGNOSIS — F1910 Other psychoactive substance abuse, uncomplicated: Secondary | ICD-10-CM | POA: Insufficient documentation

## 2013-09-06 DIAGNOSIS — Z23 Encounter for immunization: Secondary | ICD-10-CM

## 2013-09-06 LAB — POCT URINALYSIS DIP (DEVICE)
Bilirubin Urine: NEGATIVE
Glucose, UA: NEGATIVE mg/dL
Hgb urine dipstick: NEGATIVE
Ketones, ur: 15 mg/dL — AB
Leukocytes, UA: NEGATIVE
Nitrite: NEGATIVE
PROTEIN: NEGATIVE mg/dL
SPECIFIC GRAVITY, URINE: 1.02 (ref 1.005–1.030)
UROBILINOGEN UA: 1 mg/dL (ref 0.0–1.0)
pH: 6.5 (ref 5.0–8.0)

## 2013-09-06 NOTE — Progress Notes (Signed)
S: 38 yo G3P2002 @ [redacted]w[redacted]d here for ROBV - no ctx, vb, lof.  - +FM.   O: see flowsheet  A/P - will check urine and UDS today - bleeding precautions discussed - f/u in 4 weeks - ok to try zyrtec instead of claritin for allergies.

## 2013-09-06 NOTE — Patient Instructions (Signed)
Second Trimester of Pregnancy The second trimester is from week 13 through week 28, months 4 through 6. The second trimester is often a time when you feel your best. Your body has also adjusted to being pregnant, and you begin to feel better physically. Usually, morning sickness has lessened or quit completely, you may have more energy, and you may have an increase in appetite. The second trimester is also a time when the fetus is growing rapidly. At the end of the sixth month, the fetus is about 9 inches long and weighs about 1 pounds. You will likely begin to feel the baby move (quickening) between 18 and 20 weeks of the pregnancy. BODY CHANGES Your body goes through many changes during pregnancy. The changes vary from woman to woman.   Your weight will continue to increase. You will notice your lower abdomen bulging out.  You may begin to get stretch marks on your hips, abdomen, and breasts.  You may develop headaches that can be relieved by medicines approved by your caregiver.  You may urinate more often because the fetus is pressing on your bladder.  You may develop or continue to have heartburn as a result of your pregnancy.  You may develop constipation because certain hormones are causing the muscles that push waste through your intestines to slow down.  You may develop hemorrhoids or swollen, bulging veins (varicose veins).  You may have back pain because of the weight gain and pregnancy hormones relaxing your joints between the bones in your pelvis and as a result of a shift in weight and the muscles that support your balance.  Your breasts will continue to grow and be tender.  Your gums may bleed and may be sensitive to brushing and flossing.  Dark spots or blotches (chloasma, mask of pregnancy) may develop on your face. This will likely fade after the baby is born.  A dark line from your belly button to the pubic area (linea nigra) may appear. This will likely fade after the  baby is born. WHAT TO EXPECT AT YOUR PRENATAL VISITS During a routine prenatal visit:  You will be weighed to make sure you and the fetus are growing normally.  Your blood pressure will be taken.  Your abdomen will be measured to track your baby's growth.  The fetal heartbeat will be listened to.  Any test results from the previous visit will be discussed. Your caregiver may ask you:  How you are feeling.  If you are feeling the baby move.  If you have had any abnormal symptoms, such as leaking fluid, bleeding, severe headaches, or abdominal cramping.  If you have any questions. Other tests that may be performed during your second trimester include:  Blood tests that check for:  Low iron levels (anemia).  Gestational diabetes (between 24 and 28 weeks).  Rh antibodies.  Urine tests to check for infections, diabetes, or protein in the urine.  An ultrasound to confirm the proper growth and development of the baby.  An amniocentesis to check for possible genetic problems.  Fetal screens for spina bifida and Down syndrome. HOME CARE INSTRUCTIONS   Avoid all smoking, herbs, alcohol, and unprescribed drugs. These chemicals affect the formation and growth of the baby.  Follow your caregiver's instructions regarding medicine use. There are medicines that are either safe or unsafe to take during pregnancy.  Exercise only as directed by your caregiver. Experiencing uterine cramps is a good sign to stop exercising.  Continue to eat regular,   healthy meals.  Wear a good support bra for breast tenderness.  Do not use hot tubs, steam rooms, or saunas.  Wear your seat belt at all times when driving.  Avoid raw meat, uncooked cheese, cat litter boxes, and soil used by cats. These carry germs that can cause birth defects in the baby.  Take your prenatal vitamins.  Try taking a stool softener (if your caregiver approves) if you develop constipation. Eat more high-fiber foods,  such as fresh vegetables or fruit and whole grains. Drink plenty of fluids to keep your urine clear or pale yellow.  Take warm sitz baths to soothe any pain or discomfort caused by hemorrhoids. Use hemorrhoid cream if your caregiver approves.  If you develop varicose veins, wear support hose. Elevate your feet for 15 minutes, 3 4 times a day. Limit salt in your diet.  Avoid heavy lifting, wear low heel shoes, and practice good posture.  Rest with your legs elevated if you have leg cramps or low back pain.  Visit your dentist if you have not gone yet during your pregnancy. Use a soft toothbrush to brush your teeth and be gentle when you floss.  A sexual relationship may be continued unless your caregiver directs you otherwise.  Continue to go to all your prenatal visits as directed by your caregiver. SEEK MEDICAL CARE IF:   You have dizziness.  You have mild pelvic cramps, pelvic pressure, or nagging pain in the abdominal area.  You have persistent nausea, vomiting, or diarrhea.  You have a bad smelling vaginal discharge.  You have pain with urination. SEEK IMMEDIATE MEDICAL CARE IF:   You have a fever.  You are leaking fluid from your vagina.  You have spotting or bleeding from your vagina.  You have severe abdominal cramping or pain.  You have rapid weight gain or loss.  You have shortness of breath with chest pain.  You notice sudden or extreme swelling of your face, hands, ankles, feet, or legs.  You have not felt your baby move in over an hour.  You have severe headaches that do not go away with medicine.  You have vision changes. Document Released: 05/11/2001 Document Revised: 01/17/2013 Document Reviewed: 07/18/2012 Exit[employer] Patient Information 2014 [employer].  

## 2013-09-06 NOTE — Progress Notes (Signed)
P=79,  c/o a lot of mucousy nasal discharge, claritin not helping. States went to [facility] 2 weeks ago. States they gave her medicine for uti- still taking in. C/o constipation at times.

## 2013-09-10 LAB — CANNABANOIDS (GC/LC/MS), URINE: THC-COOH (GC/LC/MS), ur confirm: 112 ng/mL — AB

## 2013-09-11 LAB — COCAINE METABOLITE (GC/LC/MS), URINE: Benzoylecgonine GC/MS Conf: 1071 ng/mL — AB

## 2013-09-12 LAB — PRESCRIPTION MONITORING PROFILE (19 PANEL)
Amphetamine/Meth: NEGATIVE ng/mL
Barbiturate Screen, Urine: NEGATIVE ng/mL
Benzodiazepine Screen, Urine: NEGATIVE ng/mL
Buprenorphine, Urine: NEGATIVE ng/mL
CARISOPRODOL, URINE: NEGATIVE ng/mL
Creatinine, Urine: 174.99 mg/dL (ref >20.0)
FENTANYL URINE: NEGATIVE ng/mL
MDMA URINE: NEGATIVE ng/mL
METHADONE SCREEN, URINE: NEGATIVE ng/mL
Meperidine, Ur: NEGATIVE ng/mL
Methaqualone: NEGATIVE ng/mL
Nitrites, Initial: NEGATIVE ug/mL
OPIATE SCREEN, URINE: NEGATIVE ng/mL
OXYCODONE SCRN UR: NEGATIVE ng/mL
PHENCYCLIDINE, UR: NEGATIVE ng/mL
Propoxyphene: NEGATIVE ng/mL
Tapentadol, urine: NEGATIVE ng/mL
Tramadol Scrn, Ur: NEGATIVE ng/mL
Zolpidem, Urine: NEGATIVE ng/mL
pH, Initial: 7.2 pH (ref 4.5–8.9)

## 2013-09-13 ENCOUNTER — Telehealth: Payer: Self-pay

## 2013-09-13 NOTE — Telephone Encounter (Signed)
Pt. Called stating she is constipated and doesn't know what to do about it; she is miserable. Called pt. Who stated she called [facility] who informed her she should take Dulcolax. Informed pt. That this is correct, she may pick up docusate sodium/colace which is a stool softener and can take that twice a day along with metamucil or fibercon. Informed pt. It is also important to eat a diet rich in fiber-- fruits, veggies, beans, lean meats and whole grains. Pt. Verbalized understanding and gratitude. No questions or concerns.

## 2013-10-02 ENCOUNTER — Inpatient Hospital Stay (HOSPITAL_COMMUNITY)
Admission: AD | Admit: 2013-10-02 | Discharge: 2013-10-02 | Disposition: A | Discharge status: Home / Self Care | Payer: Medicaid Other | Source: Ambulatory Visit | Attending: Obstetrics & Gynecology | Admitting: Obstetrics & Gynecology

## 2013-10-02 ENCOUNTER — Encounter: Payer: Medicaid Other | Admitting: Family Medicine

## 2013-10-02 ENCOUNTER — Encounter (HOSPITAL_COMMUNITY): Payer: Self-pay | Admitting: General Practice

## 2013-10-02 DIAGNOSIS — O2390 Unspecified genitourinary tract infection in pregnancy, unspecified trimester: Secondary | ICD-10-CM | POA: Insufficient documentation

## 2013-10-02 DIAGNOSIS — O99330 Smoking (tobacco) complicating pregnancy, unspecified trimester: Secondary | ICD-10-CM | POA: Insufficient documentation

## 2013-10-02 DIAGNOSIS — B379 Candidiasis, unspecified: Secondary | ICD-10-CM

## 2013-10-02 DIAGNOSIS — B373 Candidiasis of vulva and vagina: Secondary | ICD-10-CM | POA: Insufficient documentation

## 2013-10-02 DIAGNOSIS — B3731 Acute candidiasis of vulva and vagina: Secondary | ICD-10-CM | POA: Insufficient documentation

## 2013-10-02 LAB — WET PREP, GENITAL
CLUE CELLS WET PREP: NONE SEEN
Trich, Wet Prep: NONE SEEN

## 2013-10-02 MED ORDER — TERCONAZOLE 0.4 % VA CREA: 1.0000 | TOPICAL_CREAM | Freq: Every day | VAGINAL | Status: DC

## 2013-10-02 MED ORDER — FLUCONAZOLE 150 MG PO TABS
150.0000 mg | ORAL_TABLET | Freq: Once | ORAL | Status: AC
  Administered 2013-10-02: 150 mg via ORAL
  Filled 2013-10-02: qty 1

## 2013-10-02 NOTE — MAU Provider Note (Signed)
Attestation of Attending Supervision of Advanced Practitioner (CNM/NP): Evaluation and management procedures were performed by the Advanced Practitioner under my supervision and collaboration.  I have reviewed the Advanced Practitioner's note and chart, and I agree with the management and plan.  Lavonia Drafts 4:43 PM

## 2013-10-02 NOTE — MAU Note (Signed)
Was on antibiotics for bladder, took it for almost a wk than noted an increase in discharge and vaginal itching.. So stopped antibiotic

## 2013-10-02 NOTE — MAU Provider Note (Signed)
  History     CSN: [id number]  Arrival date and time: 10/02/13 1523   First Provider Initiated Contact with Patient 10/02/13 1555      Chief Complaint  Patient presents with  . Vaginal Discharge   Vaginal Discharge The patient's primary symptoms include a vaginal discharge. Pertinent negatives include no abdominal pain, dysuria or urgency.    Pt is a 38 yo G3P2002 at [redacted]w[redacted]d wks IUP here for vaginal discharge x 2 weeks.  Reports having yellowish discharge with itching.  Recently completed antibiotic for urinary tract infection.  Here in [facility]U due to being late for clinic appointment.    Past Medical History  Diagnosis Date  . Dysfunctional uterine bleeding     Past Surgical History  Procedure Laterality Date  . Dilation and curettage of uterus      Family History  Problem Relation Age of Onset  . Cancer Sister     cervical    History  Substance Use Topics  . Smoking status: Current Every Day Smoker    Types: Cigarettes  . Smokeless tobacco: Not on file  . Alcohol Use: No    Allergies:  Allergies  Allergen Reactions  . Flagyl [Metronidazole] Nausea And Vomiting    Prescriptions prior to admission  Medication Sig Dispense Refill  . prenatal vitamin w/FE, FA (PRENATAL 1 + 1) 27-1 MG TABS tablet Take 1 tablet by mouth daily.  30 each  0  . PRESCRIPTION MEDICATION 1 tablet 2 (two) times daily. Antibiotic for uti, not sure of name        Review of Systems  Gastrointestinal: Negative for abdominal pain.  Genitourinary: Positive for vaginal discharge. Negative for dysuria and urgency.       Yellow vaginal discharge and itching  All other systems reviewed and are negative.  Physical Exam   Blood pressure 94/66, pulse 90, temperature 98.9 F (37.2 C), temperature source Oral, resp. rate 18, height 5\' 2"  (1.575 m), weight 55.792 kg (123 lb), last menstrual period 04/19/2013.  Physical Exam  Constitutional: She is oriented to person, place, and time. She appears  well-developed and well-nourished.  HENT:  Head: Normocephalic.  Neck: Normal range of motion. Neck supple.  Cardiovascular: Normal rate, regular rhythm and normal heart sounds.   Respiratory: Effort normal and breath sounds normal.  GI: Soft. There is no tenderness.  Fetal heart tones 143  Genitourinary: No bleeding around the vagina. Vaginal discharge (thick curdlike yellow tinged vaginal discharge) found.  Neurological: She is alert and oriented to person, place, and time.  Skin: Skin is warm and dry.    MAU Course  Procedures Results for orders placed during the hospital encounter of 10/02/13 (from the past 24 hour(s))  WET PREP, GENITAL     Status: Abnormal   Collection Time    10/02/13  3:50 PM      Result Value Ref Range   Yeast Wet Prep HPF POC MODERATE (*) NONE SEEN   Trich, Wet Prep NONE SEEN  NONE SEEN   Clue Cells Wet Prep HPF POC NONE SEEN  NONE SEEN   WBC, Wet Prep HPF POC FEW (*) NONE SEEN    Assessment and Plan  38 yo G3P2002 at [redacted]w[redacted]d wks IUP Yeast Infection  Plan: Discharge to home GC/CT pending Diflucan 150 mg PO in MAU RX Terazol cream to pharmacy  [place] 10/02/2013, 3:57 PM

## 2013-10-03 LAB — GC/CHLAMYDIA PROBE AMP
CT PROBE, AMP APTIMA: NEGATIVE
GC Probe RNA: NEGATIVE

## 2013-10-17 ENCOUNTER — Encounter: Payer: Self-pay | Admitting: Family Medicine

## 2013-10-17 ENCOUNTER — Ambulatory Visit (INDEPENDENT_AMBULATORY_CARE_PROVIDER_SITE_OTHER): Payer: Medicaid Other | Admitting: Family Medicine

## 2013-10-17 VITALS — BP 106/67 | HR 106 | Temp 98.7°F | Wt 122.6 lb

## 2013-10-17 DIAGNOSIS — F1920 Other psychoactive substance dependence, uncomplicated: Secondary | ICD-10-CM

## 2013-10-17 DIAGNOSIS — O9989 Other specified diseases and conditions complicating pregnancy, childbirth and the puerperium: Secondary | ICD-10-CM

## 2013-10-17 DIAGNOSIS — F17200 Nicotine dependence, unspecified, uncomplicated: Secondary | ICD-10-CM

## 2013-10-17 DIAGNOSIS — Z3480 Encounter for supervision of other normal pregnancy, unspecified trimester: Secondary | ICD-10-CM

## 2013-10-17 DIAGNOSIS — F17210 Nicotine dependence, cigarettes, uncomplicated: Secondary | ICD-10-CM

## 2013-10-17 DIAGNOSIS — O99320 Drug use complicating pregnancy, unspecified trimester: Secondary | ICD-10-CM

## 2013-10-17 DIAGNOSIS — O09899 Supervision of other high risk pregnancies, unspecified trimester: Secondary | ICD-10-CM

## 2013-10-17 DIAGNOSIS — R8271 Bacteriuria: Secondary | ICD-10-CM

## 2013-10-17 DIAGNOSIS — O0970 Supervision of high risk pregnancy due to social problems, unspecified trimester: Secondary | ICD-10-CM

## 2013-10-17 LAB — POCT URINALYSIS DIP (DEVICE)
BILIRUBIN URINE: NEGATIVE
Glucose, UA: NEGATIVE mg/dL
Ketones, ur: NEGATIVE mg/dL
NITRITE: NEGATIVE
PH: 7 (ref 5.0–8.0)
PROTEIN: NEGATIVE mg/dL
Specific Gravity, Urine: 1.02 (ref 1.005–1.030)
Urobilinogen, UA: 0.2 mg/dL (ref 0.0–1.0)

## 2013-10-17 MED ORDER — VARENICLINE TARTRATE 0.5 MG X 11 & 1 MG X 42 PO MISC: ORAL | Status: DC

## 2013-10-17 MED ORDER — BUPROPION HCL ER (XL) 150 MG PO TB24: 150.0000 mg | ORAL_TABLET | Freq: Every day | ORAL | Status: DC

## 2013-10-17 NOTE — Progress Notes (Signed)
C/o of yellow vaginal discharge; denies itching but states it is bothersome because "I stay wet all the time." C/o of intermittent cramping.

## 2013-10-17 NOTE — Addendum Note (Signed)
Addended by: Leamon Arnt R on: 10/17/2013 11:13 AM   Modules accepted: Orders, Medications

## 2013-10-17 NOTE — Progress Notes (Addendum)
+  FM, no LOF, no vb, no ctx Asks for assistance with smoking cessation - start buproprion Vaginal discharge yellow improving from prior pt desires reeval, neg GC/C at that time - no new sexual partners - wet mount today  Cindy Hernandez is a 38 y.o. G3P2002 at [redacted]w[redacted]d by L=24 here for [place] visit.  Discussed with Patient:  -Plans to bottle feed.  All questions answered. -Continue prenatal vitamins. - Routine precautions discussed (depression, infection s/s).   Patient provided with all pertinent phone numbers for emergencies. - RTC for any VB, regular, painful cramps/ctxs occurring at a rate of >2/10 min, fever (100.5 or higher), n/v/d, any pain that is unresolving or worsening, LOF, decreased fetal movement, CP, SOB, edema  Problems: Patient Active Problem List   Diagnosis Date Noted  . 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 Cigarette smoker one half pack a day or less 06/26/2013  . Pregnancy, normal subsequent 06/26/2013  . DUB (dysfunctional uterine bleeding) 04/14/2011  . Personal history of tobacco use, presenting hazards to health 04/14/2011  . Problems related to high-risk sexual behavior 04/14/2011    To Do: 1.wet mount 2. chantix  [ ]  Vaccines: Flu:  Tdap:  [ ]  BCM: desires BTL  Edu: [x ] PTL precautions; [ ]  BF class; [ ]  childbirth class; [ ]   BF counseling;

## 2013-10-17 NOTE — Patient Instructions (Addendum)
Second Trimester of Pregnancy The second trimester is from week 13 through week 28, months 4 through 6. The second trimester is often a time when you feel your best. Your body has also adjusted to being pregnant, and you begin to feel better physically. Usually, morning sickness has lessened or quit completely, you may have more energy, and you may have an increase in appetite. The second trimester is also a time when the fetus is growing rapidly. At the end of the sixth month, the fetus is about 9 inches long and weighs about 1 pounds. You will likely begin to feel the baby move (quickening) between 18 and 20 weeks of the pregnancy. BODY CHANGES Your body goes through many changes during pregnancy. The changes vary from woman to woman.   Your weight will continue to increase. You will notice your lower abdomen bulging out.  You may begin to get stretch marks on your hips, abdomen, and breasts.  You may develop headaches that can be relieved by medicines approved by your caregiver.  You may urinate more often because the fetus is pressing on your bladder.  You may develop or continue to have heartburn as a result of your pregnancy.  You may develop constipation because certain hormones are causing the muscles that push waste through your intestines to slow down.  You may develop hemorrhoids or swollen, bulging veins (varicose veins).  You may have back pain because of the weight gain and pregnancy hormones relaxing your joints between the bones in your pelvis and as a result of a shift in weight and the muscles that support your balance.  Your breasts will continue to grow and be tender.  Your gums may bleed and may be sensitive to brushing and flossing.  Dark spots or blotches (chloasma, mask of pregnancy) may develop on your face. This will likely fade after the baby is born.  A dark line from your belly button to the pubic area (linea nigra) may appear. This will likely fade after the  baby is born. WHAT TO EXPECT AT YOUR PRENATAL VISITS During a routine prenatal visit:  You will be weighed to make sure you and the fetus are growing normally.  Your blood pressure will be taken.  Your abdomen will be measured to track your baby's growth.  The fetal heartbeat will be listened to.  Any test results from the previous visit will be discussed. Your caregiver may ask you:  How you are feeling.  If you are feeling the baby move.  If you have had any abnormal symptoms, such as leaking fluid, bleeding, severe headaches, or abdominal cramping.  If you have any questions. Other tests that may be performed during your second trimester include:  Blood tests that check for:  Low iron levels (anemia).  Gestational diabetes (between 24 and 28 weeks).  Rh antibodies.  Urine tests to check for infections, diabetes, or protein in the urine.  An ultrasound to confirm the proper growth and development of the baby.  An amniocentesis to check for possible genetic problems.  Fetal screens for spina bifida and Down syndrome. HOME CARE INSTRUCTIONS   Avoid all smoking, herbs, alcohol, and unprescribed drugs. These chemicals affect the formation and growth of the baby.  Follow your caregiver's instructions regarding medicine use. There are medicines that are either safe or unsafe to take during pregnancy.  Exercise only as directed by your caregiver. Experiencing uterine cramps is a good sign to stop exercising.  Continue to eat regular,   healthy meals.  Wear a good support bra for breast tenderness.  Do not use hot tubs, steam rooms, or saunas.  Wear your seat belt at all times when driving.  Avoid raw meat, uncooked cheese, cat litter boxes, and soil used by cats. These carry germs that can cause birth defects in the baby.  Take your prenatal vitamins.  Try taking a stool softener (if your caregiver approves) if you develop constipation. Eat more high-fiber foods,  such as fresh vegetables or fruit and whole grains. Drink plenty of fluids to keep your urine clear or pale yellow.  Take warm sitz baths to soothe any pain or discomfort caused by hemorrhoids. Use hemorrhoid cream if your caregiver approves.  If you develop varicose veins, wear support hose. Elevate your feet for 15 minutes, 3 4 times a day. Limit salt in your diet.  Avoid heavy lifting, wear low heel shoes, and practice good posture.  Rest with your legs elevated if you have leg cramps or low back pain.  Visit your dentist if you have not gone yet during your pregnancy. Use a soft toothbrush to brush your teeth and be gentle when you floss.  A sexual relationship may be continued unless your caregiver directs you otherwise.  Continue to go to all your prenatal visits as directed by your caregiver. SEEK MEDICAL CARE IF:   You have dizziness.  You have mild pelvic cramps, pelvic pressure, or nagging pain in the abdominal area.  You have persistent nausea, vomiting, or diarrhea.  You have a bad smelling vaginal discharge.  You have pain with urination. SEEK IMMEDIATE MEDICAL CARE IF:   You have a fever.  You are leaking fluid from your vagina.  You have spotting or bleeding from your vagina.  You have severe abdominal cramping or pain.  You have rapid weight gain or loss.  You have shortness of breath with chest pain.  You notice sudden or extreme swelling of your face, hands, ankles, feet, or legs.  You have not felt your baby move in over an hour.  You have severe headaches that do not go away with medicine.  You have vision changes. Document Released: 05/11/2001 Document Revised: 01/17/2013 Document Reviewed: 07/18/2012 [facility] Patient Information 2014 [place].   Bupropion tablets (Depression/Mood Disorders) What is this medicine? BUPROPION (byoo PROE pee on) is used to treat depression. This medicine may be used for other purposes; ask your health  care provider or pharmacist if you have questions. COMMON BRAND NAME(S): Wellbutrin What should I tell my health care provider before I take this medicine? They need to know if you have any of these conditions: -an eating disorder, such as anorexia or bulimia -bipolar disorder or psychosis -diabetes or high blood sugar, treated with medication -glaucoma -heart disease, previous heart attack, or irregular heart beat -head injury or brain tumor -high blood pressure -kidney or liver disease -seizures -suicidal thoughts or a previous suicide attempt -Tourette's syndrome -weight loss -an unusual or allergic reaction to bupropion, other medicines, foods, dyes, or preservatives -breast-feeding -pregnant or trying to become pregnant How should I use this medicine? Take this medicine by mouth with a glass of water. Follow the directions on the prescription label. You can take it with or without food. If it upsets your stomach, take it with food. Take your medicine at regular intervals. Do not take your medicine more often than directed. Do not stop taking this medicine suddenly except upon the advice of your doctor. Stopping this medicine  too quickly may cause serious side effects or your condition may worsen. A special MedGuide will be given to you by the pharmacist with each prescription and refill. Be sure to read this information carefully each time. Talk to your pediatrician regarding the use of this medicine in children. Special care may be needed. Overdosage: If you think you have taken too much of this medicine contact a poison control center or emergency room at once. NOTE: This medicine is only for you. Do not share this medicine with others. What if I miss a dose? If you miss a dose, take it as soon as you can. If it is less than four hours to your next dose, take only that dose and skip the missed dose. Do not take double or extra doses. What may interact with this medicine? Do not  take this medicine with any of the following medications: -linezolid -MAOIs like Azilect, Carbex, Eldepryl, Marplan, Nardil, and Parnate -methylene blue (injected into a vein) -other medicines that contain bupropion like Zyban This medicine may also interact with the following medications: -alcohol -certain medicines for anxiety or sleep -certain medicines for blood pressure like metoprolol, propranolol -certain medicines for depression or psychotic disturbances -certain medicines for HIV or AIDS like efavirenz, lopinavir, nelfinavir, ritonavir -certain medicines for irregular heart beat like propafenone, flecainide -certain medicines for Parkinson's disease like amantadine, levodopa -certain medicines for seizures like carbamazepine, phenytoin, phenobarbital -cimetidine -clopidogrel -cyclophosphamide -furazolidone -isoniazid -nicotine -orphenadrine -procarbazine -steroid medicines like prednisone or cortisone -stimulant medicines for attention disorders, weight loss, or to stay awake -tamoxifen -theophylline -thiotepa -ticlopidine -tramadol -warfarin This list may not describe all possible interactions. Give your health care provider a list of all the medicines, herbs, non-prescription drugs, or dietary supplements you use. Also tell them if you smoke, drink alcohol, or use illegal drugs. Some items may interact with your medicine. What should I watch for while using this medicine? Tell your doctor if your symptoms do not get better or if they get worse. Visit your doctor or health care professional for regular checks on your progress. Because it may take several weeks to see the full effects of this medicine, it is important to continue your treatment as prescribed by your doctor. Patients and their families should watch out for new or worsening thoughts of suicide or depression. Also watch out for sudden changes in feelings such as feeling anxious, agitated, panicky, irritable,  hostile, aggressive, impulsive, severely restless, overly excited and hyperactive, or not being able to sleep. If this happens, especially at the beginning of treatment or after a change in dose, call your health care professional. Avoid alcoholic drinks while taking this medicine. Drinking excessive alcoholic beverages, using sleeping or anxiety medicines, or quickly stopping the use of these agents while taking this medicine may increase your risk for a seizure. Do not drive or use heavy machinery until you know how this medicine affects you. This medicine can impair your ability to perform these tasks. Do not take this medicine close to bedtime. It may prevent you from sleeping. Your mouth may get dry. Chewing sugarless gum or sucking hard candy, and drinking plenty of water may help. Contact your doctor if the problem does not go away or is severe. What side effects may I notice from receiving this medicine? Side effects that you should report to your doctor or health care professional as soon as possible: -allergic reactions like skin rash, itching or hives, swelling of the face, lips, or tongue -breathing problems -  changes in vision -confusion -fast or irregular heartbeat -hallucinations -increased blood pressure -redness, blistering, peeling or loosening of the skin, including inside the mouth -seizures -suicidal thoughts or other mood changes -unusually weak or tired -vomiting Side effects that usually do not require medical attention (report to your doctor or health care professional if they continue or are bothersome): -change in sex drive or performance -constipation -headache -loss of appetite -nausea -tremors -weight loss This list may not describe all possible side effects. Call your doctor for medical advice about side effects. You may report side effects to FDA at 1-800-FDA-1088. Where should I keep my medicine? Keep out of the reach of children. Store at room  temperature between 15 and 25 degrees C (59 and 77 degrees F), away from direct sunlight and moisture. Keep tightly closed. Throw away any unused medicine after the expiration date. NOTE: This sheet is a summary. It may not cover all possible information. If you have questions about this medicine, talk to your doctor, pharmacist, or health care provider.  2014, [employer]. (2012-12-08 12:42:42)

## 2013-10-17 NOTE — Addendum Note (Signed)
Addended by: Lu Duffel L on: 10/17/2013 12:00 PM   Modules accepted: Orders

## 2013-10-18 ENCOUNTER — Telehealth: Payer: Self-pay | Admitting: Family Medicine

## 2013-10-18 LAB — WET PREP, GENITAL
Clue Cells Wet Prep HPF POC: NONE SEEN
WBC, Wet Prep HPF POC: NONE SEEN
YEAST WET PREP: NONE SEEN

## 2013-10-18 MED ORDER — PROMETHAZINE HCL 50 MG PO TABS: 50.0000 mg | ORAL_TABLET | Freq: Once | ORAL | Status: DC

## 2013-10-18 MED ORDER — METRONIDAZOLE 500 MG PO TABS: 2000.0000 mg | ORAL_TABLET | Freq: Once | ORAL | Status: DC

## 2013-10-18 NOTE — Telephone Encounter (Signed)
+  trich infection. Tx flagyl 2g. Hx of N/V will give phenergan to take prior to administration

## 2013-10-19 ENCOUNTER — Ambulatory Visit (HOSPITAL_COMMUNITY)
Admission: RE | Admit: 2013-10-19 | Discharge: 2013-10-19 | Disposition: A | Discharge status: Home / Self Care | Payer: Medicaid Other | Source: Ambulatory Visit | Attending: Family Medicine | Admitting: Family Medicine

## 2013-10-19 DIAGNOSIS — O09899 Supervision of other high risk pregnancies, unspecified trimester: Secondary | ICD-10-CM | POA: Insufficient documentation

## 2013-10-19 DIAGNOSIS — Z3689 Encounter for other specified antenatal screening: Secondary | ICD-10-CM | POA: Insufficient documentation

## 2013-10-19 DIAGNOSIS — O0970 Supervision of high risk pregnancy due to social problems, unspecified trimester: Secondary | ICD-10-CM

## 2013-10-19 LAB — CULTURE, OB URINE
Colony Count: NO GROWTH
Organism ID, Bacteria: NO GROWTH

## 2013-10-19 NOTE — Telephone Encounter (Signed)
Called pt at home # and it kept ringing.  Called mobile # and sounded like someone picked up the phone but never said anything.  Recalled pt and pt picked up and I informed her that she tested + for trich and that an Rx for flagyl and phenergan has been sent to her pharmacy.  Pt stated that she can not take flagyl.  I informed pt that flagyl is a potent antibiotic so it can make you sick on your stomach.  I informed pt to take pheneragan 30 min before taking medication and that she can take 2 pills, then take the other 2 pills later in the day in the evening so that she is taking the dose but it would not be so harsh.  Pt stated understanding.

## 2013-10-24 ENCOUNTER — Telehealth: Payer: Self-pay | Admitting: *Deleted

## 2013-10-24 ENCOUNTER — Encounter: Payer: Self-pay | Admitting: Family Medicine

## 2013-10-24 MED ORDER — METRONIDAZOLE 500 MG PO TABS: 2000.0000 mg | ORAL_TABLET | Freq: Once | ORAL | Status: AC

## 2013-10-24 NOTE — Telephone Encounter (Signed)
Pt left message stating she ahs a question about medication which was supposed to be sent to her pharmacy. I returned the call and she stated that the pharmacy did not receive the Rx. Per chart review, the Rx was printed and not e-prescribed. I informed pt that I would send the Rx to her pharmacy and she may pick it up today. Pt was also advised that her partner should be re-tested and treated if appropriate. She should abstain from intercourse for 7 days after treatment.  Pt voiced understanding.

## 2013-10-25 ENCOUNTER — Encounter: Payer: Self-pay | Admitting: *Deleted

## 2013-11-12 ENCOUNTER — Telehealth: Payer: Self-pay | Admitting: General Practice

## 2013-11-12 NOTE — Telephone Encounter (Signed)
Patient called and left message stating her feet are starting to swell, please call back. Called patient back and she states she's been on her feet a lot and walking a lot and noticed her feet/ankles are starting to swell and she's having some pain in her legs too. Told patient that it can be normal in pregnancy for feet and ankles to swell especially if you are on your feet a lot and it's hot outside. Encourage elevation of feet, drinking plenty of water and limiting salt intake. Patient verbalized understanding to all and had no further questions

## 2013-11-14 ENCOUNTER — Other Ambulatory Visit: Payer: Self-pay | Admitting: Obstetrics and Gynecology

## 2013-11-14 ENCOUNTER — Ambulatory Visit (INDEPENDENT_AMBULATORY_CARE_PROVIDER_SITE_OTHER): Payer: Medicaid Other | Admitting: Obstetrics and Gynecology

## 2013-11-14 ENCOUNTER — Encounter: Payer: Self-pay | Admitting: Obstetrics and Gynecology

## 2013-11-14 VITALS — BP 101/68 | HR 88 | Temp 97.6°F | Wt 129.4 lb

## 2013-11-14 DIAGNOSIS — O99333 Smoking (tobacco) complicating pregnancy, third trimester: Secondary | ICD-10-CM

## 2013-11-14 DIAGNOSIS — O99330 Smoking (tobacco) complicating pregnancy, unspecified trimester: Secondary | ICD-10-CM

## 2013-11-14 DIAGNOSIS — Z3480 Encounter for supervision of other normal pregnancy, unspecified trimester: Secondary | ICD-10-CM

## 2013-11-14 LAB — POCT URINALYSIS DIP (DEVICE)
Bilirubin Urine: NEGATIVE
GLUCOSE, UA: NEGATIVE mg/dL
Hgb urine dipstick: NEGATIVE
KETONES UR: NEGATIVE mg/dL
LEUKOCYTES UA: NEGATIVE
Nitrite: NEGATIVE
Protein, ur: NEGATIVE mg/dL
Specific Gravity, Urine: 1.02 (ref 1.005–1.030)
UROBILINOGEN UA: 0.2 mg/dL (ref 0.0–1.0)
pH: 7 (ref 5.0–8.0)

## 2013-11-14 MED ORDER — PRENATAL PLUS 27-1 MG PO TABS: 1.0000 | ORAL_TABLET | Freq: Every day | ORAL | Status: DC

## 2013-11-14 NOTE — Progress Notes (Signed)
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 Did not discuss illicit drug use as teen son in room. Will get UDS next visit. If positive, [place] for growth. Has decreased smoking and does not want to start bupropion. BTS consent done. 28 wk labs done.  Yellow discharge on underwear. No irritation. Completed Flagyl. Exam: D/C white, WP sent.

## 2013-11-14 NOTE — Progress Notes (Signed)
Pt states she still has yellow vaginal d/c.  Pt denies having sex since taking Flagyl. Pt also c/o swelling of both feet and Rt leg as well as pain. New BTS papers signed.

## 2013-11-14 NOTE — Patient Instructions (Signed)
Smoking Cessation Quitting smoking is important to your health and has many advantages. However, it is not always easy to quit since nicotine is a very addictive drug. Often times, people try 3 times or more before being able to quit. This document explains the best ways for you to prepare to quit smoking. Quitting takes hard work and a lot of effort, but you can do it. ADVANTAGES OF QUITTING SMOKING  You will live longer, feel better, and live better.  Your body will feel the impact of quitting smoking almost immediately.  Within 20 minutes, blood pressure decreases. Your pulse returns to its normal level.  After 8 hours, carbon monoxide levels in the blood return to normal. Your oxygen level increases.  After 24 hours, the chance of having a heart attack starts to decrease. Your breath, hair, and body stop smelling like smoke.  After 48 hours, damaged nerve endings begin to recover. Your sense of taste and smell improve.  After 72 hours, the body is virtually free of nicotine. Your bronchial tubes relax and breathing becomes easier.  After 2 to 12 weeks, lungs can hold more air. Exercise becomes easier and circulation improves.  The risk of having a heart attack, stroke, cancer, or lung disease is greatly reduced.  After 1 year, the risk of coronary heart disease is cut in half.  After 5 years, the risk of stroke falls to the same as a nonsmoker.  After 10 years, the risk of lung cancer is cut in half and the risk of other cancers decreases significantly.  After 15 years, the risk of coronary heart disease drops, usually to the level of a nonsmoker.  If you are pregnant, quitting smoking will improve your chances of having a healthy baby.  The people you live with, especially any children, will be healthier.  You will have extra money to spend on things other than cigarettes. QUESTIONS TO THINK ABOUT BEFORE ATTEMPTING TO QUIT You may want to talk about your answers with your  caregiver.  Why do you want to quit?  If you tried to quit in the past, what helped and what did not?  What will be the most difficult situations for you after you quit? How will you plan to handle them?  Who can help you through the tough times? Your family? Friends? A caregiver?  What pleasures do you get from smoking? What ways can you still get pleasure if you quit? Here are some questions to ask your caregiver:  How can you help me to be successful at quitting?  What medicine do you think would be best for me and how should I take it?  What should I do if I need more help?  What is smoking withdrawal like? How can I get information on withdrawal? GET READY  Set a quit date.  Change your environment by getting rid of all cigarettes, ashtrays, matches, and lighters in your home, car, or work. Do not let people smoke in your home.  Review your past attempts to quit. Think about what worked and what did not. GET SUPPORT AND ENCOURAGEMENT You have a better chance of being successful if you have help. You can get support in many ways.  Tell your family, friends, and co-workers that you are going to quit and need their support. Ask them not to smoke around you.  Get individual, group, or telephone counseling and support. Programs are available at local hospitals and health centers. Call your local health department for   information about programs in your area.  Spiritual beliefs and practices may help some smokers quit.  Download a "quit meter" on your computer to keep track of quit statistics, such as how long you have gone without smoking, cigarettes not smoked, and money saved.  Get a self-help book about quitting smoking and staying off of tobacco. LEARN NEW SKILLS AND BEHAVIORS  Distract yourself from urges to smoke. Talk to someone, go for a walk, or occupy your time with a task.  Change your normal routine. Take a different route to work. Drink tea instead of coffee.  Eat breakfast in a different place.  Reduce your stress. Take a hot bath, exercise, or read a book.  Plan something enjoyable to do every day. Reward yourself for not smoking.  Explore interactive web-based programs that specialize in helping you quit. GET MEDICINE AND USE IT CORRECTLY Medicines can help you stop smoking and decrease the urge to smoke. Combining medicine with the above behavioral methods and support can greatly increase your chances of successfully quitting smoking.  Nicotine replacement therapy helps deliver nicotine to your body without the negative effects and risks of smoking. Nicotine replacement therapy includes nicotine gum, lozenges, inhalers, nasal sprays, and skin patches. Some may be available over-the-counter and others require a prescription.  Antidepressant medicine helps people abstain from smoking, but how this works is unknown. This medicine is available by prescription.  Nicotinic receptor partial agonist medicine simulates the effect of nicotine in your brain. This medicine is available by prescription. Ask your caregiver for advice about which medicines to use and how to use them based on your health history. Your caregiver will tell you what side effects to look out for if you choose to be on a medicine or therapy. Carefully read the information on the package. Do not use any other product containing nicotine while using a nicotine replacement product.  RELAPSE OR DIFFICULT SITUATIONS Most relapses occur within the first 3 months after quitting. Do not be discouraged if you start smoking again. Remember, most people try several times before finally quitting. You may have symptoms of withdrawal because your body is used to nicotine. You may crave cigarettes, be irritable, feel very hungry, cough often, get headaches, or have difficulty concentrating. The withdrawal symptoms are only temporary. They are strongest when you first quit, but they will go away within  10-14 days. To reduce the chances of relapse, try to:  Avoid drinking alcohol. Drinking lowers your chances of successfully quitting.  Reduce the amount of caffeine you consume. Once you quit smoking, the amount of caffeine in your body increases and can give you symptoms, such as a rapid heartbeat, sweating, and anxiety.  Avoid smokers because they can make you want to smoke.  Do not let weight gain distract you. Many smokers will gain weight when they quit, usually less than 10 pounds. Eat a healthy diet and stay active. You can always lose the weight gained after you quit.  Find ways to improve your mood other than smoking. FOR MORE INFORMATION  www.smokefree.gov  Document Released: 05/11/2001 Document Revised: 11/16/2011 Document Reviewed: 08/26/2011 [employer] Patient Information 2015 [employer]. This information is not intended to replace advice given to you by your health care provider. Make sure you discuss any questions you have with your health care provider.  

## 2013-11-15 LAB — HIV ANTIBODY (ROUTINE TESTING W REFLEX): HIV: NONREACTIVE

## 2013-11-15 LAB — CBC
HEMATOCRIT: 28.3 % — AB (ref 36.0–46.0)
Hemoglobin: 9.9 g/dL — ABNORMAL LOW (ref 12.0–15.0)
MCH: 34.3 pg — ABNORMAL HIGH (ref 26.0–34.0)
MCHC: 35 g/dL (ref 30.0–36.0)
MCV: 97.9 fL (ref 78.0–100.0)
Platelets: 237 10*3/uL (ref 150–400)
RBC: 2.89 MIL/uL — ABNORMAL LOW (ref 3.87–5.11)
RDW: 13.7 % (ref 11.5–15.5)
WBC: 10.9 10*3/uL — ABNORMAL HIGH (ref 4.0–10.5)

## 2013-11-15 LAB — GLUCOSE TOLERANCE, 1 HOUR (50G) W/O FASTING: Glucose, 1 Hour GTT: 103 mg/dL (ref 70–140)

## 2013-11-15 LAB — WET PREP, GENITAL
Trich, Wet Prep: NONE SEEN
WBC WET PREP: NONE SEEN
Yeast Wet Prep HPF POC: NONE SEEN

## 2013-11-15 LAB — RPR

## 2013-11-17 LAB — GC/CHLAMYDIA PROBE AMP
CT PROBE, AMP APTIMA: NEGATIVE
GC PROBE AMP APTIMA: NEGATIVE

## 2013-11-20 ENCOUNTER — Telehealth: Payer: Self-pay | Admitting: General Practice

## 2013-11-20 DIAGNOSIS — B9689 Other specified bacterial agents as the cause of diseases classified elsewhere: Secondary | ICD-10-CM

## 2013-11-20 DIAGNOSIS — N760 Acute vaginitis: Principal | ICD-10-CM

## 2013-11-20 MED ORDER — METRONIDAZOLE 0.75 % VA GEL: 1.0000 | Freq: Every day | VAGINAL | Status: DC

## 2013-11-20 NOTE — Telephone Encounter (Signed)
Per chart review patient's wet prep came back with BV. Called patient and informed her of BV and metrogel being sent to her pharmacy due to her allergy to flagyl. Told patient that since this is a cream she shouldn't have problems with nausea or vomiting. Patient verbalized understanding and stated she had a paper from her work that we needed to fill out.told patient she can bring it by here and drop it off. Patient verbalized understanding and had no other questions

## 2013-12-04 ENCOUNTER — Encounter (HOSPITAL_COMMUNITY): Payer: Self-pay | Admitting: *Deleted

## 2013-12-04 ENCOUNTER — Inpatient Hospital Stay (HOSPITAL_COMMUNITY)
Admission: AD | Admit: 2013-12-04 | Discharge: 2013-12-04 | Disposition: A | Discharge status: Home / Self Care | Payer: Medicaid Other | Source: Ambulatory Visit | Attending: Obstetrics & Gynecology | Admitting: Obstetrics & Gynecology

## 2013-12-04 DIAGNOSIS — B3731 Acute candidiasis of vulva and vagina: Secondary | ICD-10-CM | POA: Insufficient documentation

## 2013-12-04 DIAGNOSIS — B373 Candidiasis of vulva and vagina: Secondary | ICD-10-CM

## 2013-12-04 DIAGNOSIS — O2390 Unspecified genitourinary tract infection in pregnancy, unspecified trimester: Secondary | ICD-10-CM | POA: Diagnosis not present

## 2013-12-04 DIAGNOSIS — O99330 Smoking (tobacco) complicating pregnancy, unspecified trimester: Secondary | ICD-10-CM | POA: Insufficient documentation

## 2013-12-04 DIAGNOSIS — L293 Anogenital pruritus, unspecified: Secondary | ICD-10-CM | POA: Diagnosis not present

## 2013-12-04 DIAGNOSIS — K5900 Constipation, unspecified: Secondary | ICD-10-CM | POA: Insufficient documentation

## 2013-12-04 DIAGNOSIS — O99891 Other specified diseases and conditions complicating pregnancy: Secondary | ICD-10-CM | POA: Diagnosis present

## 2013-12-04 LAB — RAPID URINE DRUG SCREEN, HOSP PERFORMED
Amphetamines: NOT DETECTED
BARBITURATES: NOT DETECTED
Benzodiazepines: NOT DETECTED
Cocaine: NOT DETECTED
OPIATES: NOT DETECTED
TETRAHYDROCANNABINOL: POSITIVE — AB

## 2013-12-04 LAB — URINALYSIS, ROUTINE W REFLEX MICROSCOPIC
BILIRUBIN URINE: NEGATIVE
GLUCOSE, UA: NEGATIVE mg/dL
HGB URINE DIPSTICK: NEGATIVE
Ketones, ur: NEGATIVE mg/dL
Leukocytes, UA: NEGATIVE
Nitrite: NEGATIVE
Protein, ur: NEGATIVE mg/dL
SPECIFIC GRAVITY, URINE: 1.015 (ref 1.005–1.030)
UROBILINOGEN UA: 0.2 mg/dL (ref 0.0–1.0)
pH: 7 (ref 5.0–8.0)

## 2013-12-04 LAB — WET PREP, GENITAL
CLUE CELLS WET PREP: NONE SEEN
Trich, Wet Prep: NONE SEEN

## 2013-12-04 MED ORDER — FLEET ENEMA 7-19 GM/118ML RE ENEM
1.0000 | ENEMA | Freq: Once | RECTAL | Status: AC
  Administered 2013-12-04: 1 via RECTAL

## 2013-12-04 MED ORDER — TERCONAZOLE 0.4 % VA CREA: 1.0000 | TOPICAL_CREAM | Freq: Every day | VAGINAL | Status: DC

## 2013-12-04 MED ORDER — POLYETHYLENE GLYCOL 3350 17 G PO PACK: 17.0000 g | PACK | Freq: Every day | ORAL | Status: DC

## 2013-12-04 MED ORDER — ACETAMINOPHEN 325 MG PO TABS
650.0000 mg | ORAL_TABLET | Freq: Four times a day (QID) | ORAL | Status: DC | PRN
  Administered 2013-12-04: 650 mg via ORAL
  Filled 2013-12-04: qty 2

## 2013-12-04 NOTE — MAU Provider Note (Signed)
History     CSN: [id number]  Arrival date and time: 12/04/13 0545   First Provider Initiated Contact with Patient 12/04/13 [phone]      Chief Complaint  Patient presents with  . Vaginal Bleeding  . Vaginal Itching  . Contractions   Vaginal Bleeding Associated symptoms include abdominal pain and constipation. Pertinent negatives include no chills, diarrhea, fever, nausea or vomiting.  Vaginal Itching Associated symptoms include abdominal pain and constipation. Pertinent negatives include no chills, diarrhea, fever, nausea or vomiting.   This is a 38 y.o. female at [redacted]w[redacted]d who presents with c/o contractions for two days. More frequent and strong this morning. Had one spot of blood with wiping. Has had itching and irritation of vagina since using Metrogel.  Has been rubbing area due to this.  Also reports no BM in over a week. Has not tried any meds, only some fruit.   OB History   Grav Para Term Preterm Abortions TAB SAB Ect Mult Living   3 2 2  0 0 0 0 0 0 2      Past Medical History  Diagnosis Date  . Dysfunctional uterine bleeding     Past Surgical History  Procedure Laterality Date  . Dilation and curettage of uterus      Family History  Problem Relation Age of Onset  . Cancer Sister     cervical    History  Substance Use Topics  . Smoking status: Current Every Day Smoker -- 0.50 packs/day for 20 years    Types: Cigarettes  . Smokeless tobacco: Not on file  . Alcohol Use: No    Allergies:  Allergies  Allergen Reactions  . Flagyl [Metronidazole] Nausea And Vomiting    Prescriptions prior to admission  Medication Sig Dispense Refill  . buPROPion (WELLBUTRIN XL) 150 MG 24 hr tablet Take 1 tablet (150 mg total) by mouth daily.  63 tablet  1  . metroNIDAZOLE (METROGEL VAGINAL) 0.75 % vaginal gel Place 1 Applicatorful vaginally at bedtime. Every night for 5 nights  70 g  0  . Prenatal Vit-Fe Fumarate-FA (PRENATAL MULTIVITAMIN) TABS tablet Take 1 tablet by mouth  daily at 12 noon.      . prenatal vitamin w/FE, FA (PRENATAL 1 + 1) 27-1 MG TABS tablet Take 1 tablet by mouth daily.  30 each  10  . promethazine (PHENERGAN) 50 MG tablet Take 1 tablet (50 mg total) by mouth once. Take 15 minutes before flagyl for nausea and vomiting  1 tablet  0  . terconazole (TERAZOL 7) 0.4 % vaginal cream Place 1 applicator vaginally at bedtime.  45 g  0    Review of Systems  Constitutional: Negative for fever and chills.  Gastrointestinal: Positive for abdominal pain and constipation. Negative for nausea, vomiting and diarrhea.  Genitourinary: Positive for vaginal bleeding.       Contractions Spotting   Neurological: Negative for dizziness.   Physical Exam   Blood pressure 107/66, pulse 85, temperature 98.1 F (36.7 C), temperature source Oral, resp. rate 18, height 5\' 3"  (1.6 m), weight 59.421 kg (131 lb), last menstrual period 04/19/2013.  Physical Exam  Constitutional: She is oriented to person, place, and time. She appears well-developed and well-nourished. No distress.  HENT:  Head: Normocephalic.  Cardiovascular: Normal rate.   Respiratory: Effort normal.  GI: Soft. She exhibits distension (mild throughout). There is tenderness (diffusely all over). There is no rebound and no guarding.  Musculoskeletal: Normal range of motion.  Neurological: She is alert  and oriented to person, place, and time.  Skin: Skin is warm and dry.  Psychiatric: She has a normal mood and affect.   FHR reactive Irregular contractions every 10 min, with some irritability  Dilation: Fingertip Effacement (%): 30 Station: -3 Exam by:: Hansel Feinstein, CNM  No blood seen on exam, but FFn not done due to hx bleeding  MAU Course  Procedures  MDM Cath UA done since pt could not void after enema Enema for constipation TYlenol for pain  Results for orders placed during the hospital encounter of 12/04/13 (from the past 24 hour(s))  WET PREP, GENITAL     Status: Abnormal    Collection Time    12/04/13  6:30 AM      Result Value Ref Range   Yeast Wet Prep HPF POC FEW (*) NONE SEEN   Trich, Wet Prep NONE SEEN  NONE SEEN   Clue Cells Wet Prep HPF POC NONE SEEN  NONE SEEN   WBC, Wet Prep HPF POC FEW (*) NONE SEEN  URINALYSIS, ROUTINE W REFLEX MICROSCOPIC     Status: None   Collection Time    12/04/13  8:13 AM      Result Value Ref Range   Color, Urine YELLOW  YELLOW   APPearance CLEAR  CLEAR   Specific Gravity, Urine 1.015  1.005 - 1.030   pH 7.0  5.0 - 8.0   Glucose, UA NEGATIVE  NEGATIVE mg/dL   Hgb urine dipstick NEGATIVE  NEGATIVE   Bilirubin Urine NEGATIVE  NEGATIVE   Ketones, ur NEGATIVE  NEGATIVE mg/dL   Protein, ur NEGATIVE  NEGATIVE mg/dL   Urobilinogen, UA 0.2  0.0 - 1.0 mg/dL   Nitrite NEGATIVE  NEGATIVE   Leukocytes, UA NEGATIVE  NEGATIVE     Assessment and Plan  A:  SIUP at [redacted]w[redacted]d       Constipation x 2 weeks       Vaginal irritation, Yeast vaginitis  Report given to oncoming provider.  Hansel Feinstein 12/04/2013, 6:30 AM   Plan: Discharge to home Category I FHR Tracing RX Terazol Keep scheduled appointment in clinic  [place], [place]

## 2013-12-04 NOTE — Discharge Instructions (Signed)

## 2013-12-04 NOTE — MAU Note (Addendum)
Pt states she has been contracting on and off for 2-3 days. C/o of vaginal itching x 2 days, vaginal bleeding this morning upon wiping after using the restroom. Positive fetal movement. Pt was dx with BV on June 20, and she just finished her medicine on July 3. (per pt).

## 2013-12-06 ENCOUNTER — Encounter: Payer: Self-pay | Admitting: *Deleted

## 2013-12-10 NOTE — MAU Provider Note (Signed)
Attestation of Attending Supervision of Advanced Practitioner (CNM/NP): Evaluation and management procedures were performed by the Advanced Practitioner under my supervision and collaboration.  I have reviewed the Advanced Practitioner's note and chart, and I agree with the management and plan.  HARRAWAY-SMITH, Linton Stolp 11:27 AM

## 2013-12-12 ENCOUNTER — Ambulatory Visit (INDEPENDENT_AMBULATORY_CARE_PROVIDER_SITE_OTHER): Payer: Medicaid Other | Admitting: Obstetrics and Gynecology

## 2013-12-12 ENCOUNTER — Encounter: Payer: Self-pay | Admitting: Obstetrics and Gynecology

## 2013-12-12 VITALS — BP 112/63 | HR 88 | Temp 98.2°F | Wt 131.8 lb

## 2013-12-12 DIAGNOSIS — O99333 Smoking (tobacco) complicating pregnancy, third trimester: Secondary | ICD-10-CM

## 2013-12-12 DIAGNOSIS — F1920 Other psychoactive substance dependence, uncomplicated: Secondary | ICD-10-CM

## 2013-12-12 DIAGNOSIS — O99320 Drug use complicating pregnancy, unspecified trimester: Secondary | ICD-10-CM

## 2013-12-12 DIAGNOSIS — Z3480 Encounter for supervision of other normal pregnancy, unspecified trimester: Secondary | ICD-10-CM

## 2013-12-12 DIAGNOSIS — O26893 Other specified pregnancy related conditions, third trimester: Secondary | ICD-10-CM

## 2013-12-12 DIAGNOSIS — R12 Heartburn: Secondary | ICD-10-CM

## 2013-12-12 DIAGNOSIS — Z3483 Encounter for supervision of other normal pregnancy, third trimester: Secondary | ICD-10-CM

## 2013-12-12 DIAGNOSIS — O9989 Other specified diseases and conditions complicating pregnancy, childbirth and the puerperium: Secondary | ICD-10-CM

## 2013-12-12 DIAGNOSIS — O99330 Smoking (tobacco) complicating pregnancy, unspecified trimester: Secondary | ICD-10-CM

## 2013-12-12 MED ORDER — RANITIDINE HCL 150 MG PO TABS: 150.0000 mg | ORAL_TABLET | Freq: Every day | ORAL | Status: DC

## 2013-12-12 NOTE — Progress Notes (Signed)
.   Saw pt and agree with Dr. Marissa Calamity note. Still smoking> again urged to stop. RLP discussed

## 2013-12-12 NOTE — Progress Notes (Signed)
Pt reports constant pelvic and vaginal pain.  Pt had MAU visit on 7/7.

## 2013-12-12 NOTE — Progress Notes (Signed)
Cindy Hernandez is a 38 y.o. G3P2002 at [redacted]w[redacted]d for routine follow up.  She reports heartburn and vaginal pain. Vaginal pain has been present for weeks, shooting pain when standing/sitting/laying down. Having vaginal discharge yellow and just finished Terazol for yeast infection.  See flow sheet for details.  A/P: Pregnancy at [redacted]w[redacted]d.  Doing well.   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 Patient has not had Tdap yet as of 7/15 GBS/GC/CZ testing was not performed today.  Preterm labor precautions reviewed. Safe sleep discussed. Kick counts reviewed. Follow up 2 weeks.

## 2013-12-17 LAB — OB RESULTS CONSOLE GBS: GBS: NEGATIVE

## 2013-12-27 ENCOUNTER — Ambulatory Visit (INDEPENDENT_AMBULATORY_CARE_PROVIDER_SITE_OTHER): Payer: Medicaid Other | Admitting: Family

## 2013-12-27 VITALS — BP 111/66 | HR 88 | Temp 97.9°F | Wt 133.0 lb

## 2013-12-27 DIAGNOSIS — Z3400 Encounter for supervision of normal first pregnancy, unspecified trimester: Secondary | ICD-10-CM

## 2013-12-27 DIAGNOSIS — Z3403 Encounter for supervision of normal first pregnancy, third trimester: Secondary | ICD-10-CM

## 2013-12-27 LAB — OB RESULTS CONSOLE GC/CHLAMYDIA
Chlamydia: NEGATIVE
GC PROBE AMP, GENITAL: NEGATIVE

## 2013-12-27 LAB — POCT URINALYSIS DIP (DEVICE)
Bilirubin Urine: NEGATIVE
Glucose, UA: NEGATIVE mg/dL
Hgb urine dipstick: NEGATIVE
Ketones, ur: NEGATIVE mg/dL
Nitrite: NEGATIVE
PH: 7 (ref 5.0–8.0)
PROTEIN: NEGATIVE mg/dL
SPECIFIC GRAVITY, URINE: 1.015 (ref 1.005–1.030)
UROBILINOGEN UA: 0.2 mg/dL (ref 0.0–1.0)

## 2013-12-27 MED ORDER — FAMOTIDINE 40 MG PO TABS: 40.0000 mg | ORAL_TABLET | Freq: Every day | ORAL | Status: DC

## 2013-12-27 NOTE — Progress Notes (Signed)
Pelvic pressure, no bleeding or leaking of fluid.  Reviewed labor precautions.  RX Pepcid for heartburn.  Urine results pending at discharge.

## 2013-12-27 NOTE — Progress Notes (Signed)
Reports intermittent pelvic pressure.  GBS and cultures today.

## 2013-12-28 LAB — GC/CHLAMYDIA PROBE AMP
CT Probe RNA: NEGATIVE
GC Probe RNA: NEGATIVE

## 2013-12-30 LAB — CULTURE, BETA STREP (GROUP B ONLY)

## 2013-12-31 ENCOUNTER — Encounter: Payer: Self-pay | Admitting: Family

## 2014-01-03 ENCOUNTER — Ambulatory Visit (INDEPENDENT_AMBULATORY_CARE_PROVIDER_SITE_OTHER): Payer: Medicaid Other | Admitting: Obstetrics and Gynecology

## 2014-01-03 ENCOUNTER — Encounter: Payer: Self-pay | Admitting: Obstetrics and Gynecology

## 2014-01-03 VITALS — BP 111/66 | HR 89 | Temp 98.0°F | Wt 134.4 lb

## 2014-01-03 DIAGNOSIS — F1920 Other psychoactive substance dependence, uncomplicated: Secondary | ICD-10-CM

## 2014-01-03 DIAGNOSIS — Z3480 Encounter for supervision of other normal pregnancy, unspecified trimester: Secondary | ICD-10-CM

## 2014-01-03 DIAGNOSIS — O99320 Drug use complicating pregnancy, unspecified trimester: Secondary | ICD-10-CM

## 2014-01-03 DIAGNOSIS — Z23 Encounter for immunization: Secondary | ICD-10-CM

## 2014-01-03 DIAGNOSIS — Z3483 Encounter for supervision of other normal pregnancy, third trimester: Secondary | ICD-10-CM

## 2014-01-03 LAB — POCT URINALYSIS DIP (DEVICE)
Bilirubin Urine: NEGATIVE
Glucose, UA: NEGATIVE mg/dL
Hgb urine dipstick: NEGATIVE
Ketones, ur: NEGATIVE mg/dL
Nitrite: NEGATIVE
PH: 7.5 (ref 5.0–8.0)
Protein, ur: NEGATIVE mg/dL
SPECIFIC GRAVITY, URINE: 1.015 (ref 1.005–1.030)
UROBILINOGEN UA: 0.2 mg/dL (ref 0.0–1.0)

## 2014-01-03 MED ORDER — TETANUS-DIPHTH-ACELL PERTUSSIS 5-2.5-18.5 LF-MCG/0.5 IM SUSP: 0.5000 mL | Freq: Once | INTRAMUSCULAR | Status: DC

## 2014-01-03 NOTE — Progress Notes (Signed)
Patient did not leave urine sample today. Unable to obtain drug screen.

## 2014-01-03 NOTE — Progress Notes (Signed)
C/o of intermittent pelvic pressure and contractions.

## 2014-01-03 NOTE — Addendum Note (Signed)
Addended by: Rutherford Nail E on: 01/03/2014 10:50 AM   Modules accepted: Orders

## 2014-01-03 NOTE — Progress Notes (Signed)
Patient left sample late. Sent for uds.

## 2014-01-03 NOTE — Addendum Note (Signed)
Addended by: Christiana Pellant A on: 01/03/2014 11:34 AM   Modules accepted: Orders

## 2014-01-03 NOTE — Progress Notes (Signed)
Patient is doing well without complaints. FM/labor precautions reviewed. Patient admits to smoking THC today

## 2014-01-06 LAB — CANNABANOIDS (GC/LC/MS), URINE: THC-COOH UR CONFIRM: 27 ng/mL — AB (ref <5)

## 2014-01-08 LAB — PRESCRIPTION MONITORING PROFILE (19 PANEL)
AMPHETAMINE/METH: NEGATIVE ng/mL
BARBITURATE SCREEN, URINE: NEGATIVE ng/mL
BENZODIAZEPINE SCREEN, URINE: NEGATIVE ng/mL
Buprenorphine, Urine: NEGATIVE ng/mL
CREATININE, URINE: 119.75 mg/dL (ref >20.0)
Carisoprodol, Urine: NEGATIVE ng/mL
Cocaine Metabolites: NEGATIVE ng/mL
Fentanyl, Ur: NEGATIVE ng/mL
MDMA URINE: NEGATIVE ng/mL
Meperidine, Ur: NEGATIVE ng/mL
Methadone Screen, Urine: NEGATIVE ng/mL
Methaqualone: NEGATIVE ng/mL
Nitrites, Initial: NEGATIVE ug/mL
OXYCODONE SCRN UR: NEGATIVE ng/mL
Opiate Screen, Urine: NEGATIVE ng/mL
Phencyclidine, Ur: NEGATIVE ng/mL
Propoxyphene: NEGATIVE ng/mL
TAPENTADOLUR: NEGATIVE ng/mL
Tramadol Scrn, Ur: NEGATIVE ng/mL
ZOLPIDEM, URINE: NEGATIVE ng/mL
pH, Initial: 7.8 pH (ref 4.5–8.9)

## 2014-01-11 ENCOUNTER — Inpatient Hospital Stay (HOSPITAL_COMMUNITY)
Admission: AD | Admit: 2014-01-11 | Discharge: 2014-01-11 | Disposition: A | Discharge status: Home / Self Care | Payer: Medicaid Other | Source: Ambulatory Visit | Attending: Obstetrics and Gynecology | Admitting: Obstetrics and Gynecology

## 2014-01-11 ENCOUNTER — Encounter (HOSPITAL_COMMUNITY): Payer: Self-pay | Admitting: *Deleted

## 2014-01-11 DIAGNOSIS — O9989 Other specified diseases and conditions complicating pregnancy, childbirth and the puerperium: Secondary | ICD-10-CM

## 2014-01-11 DIAGNOSIS — F1210 Cannabis abuse, uncomplicated: Secondary | ICD-10-CM | POA: Diagnosis not present

## 2014-01-11 DIAGNOSIS — O99340 Other mental disorders complicating pregnancy, unspecified trimester: Secondary | ICD-10-CM | POA: Insufficient documentation

## 2014-01-11 DIAGNOSIS — O99330 Smoking (tobacco) complicating pregnancy, unspecified trimester: Secondary | ICD-10-CM | POA: Insufficient documentation

## 2014-01-11 DIAGNOSIS — F1920 Other psychoactive substance dependence, uncomplicated: Secondary | ICD-10-CM

## 2014-01-11 DIAGNOSIS — O99891 Other specified diseases and conditions complicating pregnancy: Secondary | ICD-10-CM | POA: Insufficient documentation

## 2014-01-11 DIAGNOSIS — F1410 Cocaine abuse, uncomplicated: Secondary | ICD-10-CM | POA: Diagnosis not present

## 2014-01-11 DIAGNOSIS — O99320 Drug use complicating pregnancy, unspecified trimester: Secondary | ICD-10-CM

## 2014-01-11 DIAGNOSIS — M549 Dorsalgia, unspecified: Secondary | ICD-10-CM | POA: Diagnosis present

## 2014-01-11 DIAGNOSIS — O09529 Supervision of elderly multigravida, unspecified trimester: Secondary | ICD-10-CM | POA: Diagnosis not present

## 2014-01-11 MED ORDER — CYCLOBENZAPRINE HCL 10 MG PO TABS
10.0000 mg | ORAL_TABLET | Freq: Once | ORAL | Status: AC
  Administered 2014-01-11: 10 mg via ORAL
  Filled 2014-01-11: qty 1

## 2014-01-11 NOTE — Discharge Instructions (Signed)
Braxton Hicks Contractions °Contractions of the uterus can occur throughout pregnancy. Contractions are not always a sign that you are in labor.  °WHAT ARE BRAXTON HICKS CONTRACTIONS?  °Contractions that occur before labor are called Braxton Hicks contractions, or false labor. Toward the end of pregnancy (32-34 weeks), these contractions can develop more often and may become more forceful. This is not true labor because these contractions do not result in opening (dilatation) and thinning of the cervix. They are sometimes difficult to tell apart from true labor because these contractions can be forceful and people have different pain tolerances. You should not feel embarrassed if you go to the hospital with false labor. Sometimes, the only way to tell if you are in true labor is for your health care provider to look for changes in the cervix. °If there are no prenatal problems or other health problems associated with the pregnancy, it is completely safe to be sent home with false labor and await the onset of true labor. °HOW CAN YOU TELL THE DIFFERENCE BETWEEN TRUE AND FALSE LABOR? °False Labor °· The contractions of false labor are usually shorter and not as hard as those of true labor.   °· The contractions are usually irregular.   °· The contractions are often felt in the front of the lower abdomen and in the groin.   °· The contractions may go away when you walk around or change positions while lying down.   °· The contractions get weaker and are shorter lasting as time goes on.   °· The contractions do not usually become progressively stronger, regular, and closer together as with true labor.   °True Labor °· Contractions in true labor last 30-70 seconds, become very regular, usually become more intense, and increase in frequency.   °· The contractions do not go away with walking.   °· The discomfort is usually felt in the top of the uterus and spreads to the lower abdomen and low back.   °· True labor can be  determined by your health care provider with an exam. This will show that the cervix is dilating and getting thinner.   °WHAT TO REMEMBER °· Keep up with your usual exercises and follow other instructions given by your health care provider.   °· Take medicines as directed by your health care provider.   °· Keep your regular prenatal appointments.   °· Eat and drink lightly if you think you are going into labor.   °· If Braxton Hicks contractions are making you uncomfortable:   °¨ Change your position from lying down or resting to walking, or from walking to resting.   °¨ Sit and rest in a tub of warm water.   °¨ Drink 2-3 glasses of water. Dehydration may cause these contractions.   °¨ Do slow and deep breathing several times an hour.   °WHEN SHOULD I SEEK IMMEDIATE MEDICAL CARE? °Seek immediate medical care if: °· Your contractions become stronger, more regular, and closer together.   °· You have fluid leaking or gushing from your vagina.   °· You have a fever.   °· You pass blood-tinged mucus.   °· You have vaginal bleeding.   °· You have continuous abdominal pain.   °· You have low back pain that you never had before.   °· You feel your baby's head pushing down and causing pelvic pressure.   °· Your baby is not moving as much as it used to.   °Document Released: 05/17/2005 Document Revised: 05/22/2013 Document Reviewed: 02/26/2013 °Exit[employer]® Patient Information ©2015 [employer]. This information is not intended to replace advice given to you by your health care   provider. Make sure you discuss any questions you have with your health care provider. ° °

## 2014-01-11 NOTE — MAU Provider Note (Signed)
Attestation of Attending Supervision of Advanced Practitioner (CNM/NP): Evaluation and management procedures were performed by the Advanced Practitioner under my supervision and collaboration.  I have reviewed the Advanced Practitioner's note and chart, and I agree with the management and plan.  Garin Mata 01/11/2014 3:14 PM

## 2014-01-11 NOTE — MAU Provider Note (Signed)
  History     CSN: [id number]  Arrival date and time: 01/11/14 1115   None     No chief complaint on file.  HPI Cindy Hernandez is a 38yo [id number] @ 38.1wks by LMP and confirmed by U/S who presents initially for eval of back pain. Denies leaking or bldg. Reports +FM. Her preg has been followed by the [facility] and has been remarkable for 1) smoker 2) cocaine/THC use in preg 3) AMA. Most recent UDS on 8/6 +THC only.  OB History   Grav Para Term Preterm Abortions TAB SAB Ect Mult Living   3 2 2  0 0 0 0 0 0 2      Past Medical History  Diagnosis Date  . Dysfunctional uterine bleeding     Past Surgical History  Procedure Laterality Date  . Dilation and curettage of uterus    . Wisdom tooth extraction      Family History  Problem Relation Age of Onset  . Cancer Sister     cervical    History  Substance Use Topics  . Smoking status: Current Every Day Smoker -- 0.50 packs/day for 20 years    Types: Cigarettes  . Smokeless tobacco: Not on file  . Alcohol Use: No    Allergies:  Allergies  Allergen Reactions  . Flagyl [Metronidazole] Nausea And Vomiting    Facility-administered medications prior to admission  Medication Dose Route Frequency Provider Last Rate Last Dose  . Tdap (BOOSTRIX) injection 0.5 mL  0.5 mL Intramuscular Once Mora Bellman, MD       Prescriptions prior to admission  Medication Sig Dispense Refill  . calcium carbonate (TUMS - DOSED IN MG ELEMENTAL CALCIUM) 500 MG chewable tablet Chew 3 tablets by mouth 2 (two) times daily as needed for indigestion or heartburn.      . flintstones complete (FLINTSTONES) 60 MG chewable tablet Chew 3 tablets by mouth daily.      . polyethylene glycol (MIRALAX) packet Take 17 g by mouth daily.  14 each  1    ROS Physical Exam   Blood pressure 108/68, pulse 92, temperature 98 F (36.7 C), temperature source Oral, resp. rate 18, height 5\' 3"  (1.6 m), weight 61.689 kg (136 lb), last menstrual period 04/19/2013.  Physical Exam   Constitutional: She is oriented to person, place, and time. She appears well-developed.  HENT:  Head: Normocephalic.  Cardiovascular: Normal rate.   Respiratory: Effort normal.  GI:  EFM 120s +accels, no decels irreg ctx   Genitourinary: Vagina normal.  Cx C/L (unchanged over time)  Musculoskeletal: Normal range of motion.  Neurological: She is alert and oriented to person, place, and time.  Skin: Skin is warm and dry.  Psychiatric: She has a normal mood and affect. Her behavior is normal. Thought content normal.    MAU Course  Procedures Given Flexeril 10mg  in MAU for back pain- effective.  Assessment and Plan  IUP at 38.1wks Discomforts of late preg  D/C home with labor precautions rev'd Keep f/u on 8/20 as scheduled or sooner prn  [name] CNM 01/11/2014, 2:05 PM

## 2014-01-16 ENCOUNTER — Inpatient Hospital Stay (HOSPITAL_COMMUNITY)
Admission: AD | Admit: 2014-01-16 | Discharge: 2014-01-16 | Discharge status: Against Medical Advice | Payer: Medicaid Other | Source: Ambulatory Visit | Attending: Obstetrics and Gynecology | Admitting: Obstetrics and Gynecology

## 2014-01-16 DIAGNOSIS — R42 Dizziness and giddiness: Secondary | ICD-10-CM | POA: Insufficient documentation

## 2014-01-16 NOTE — MAU Note (Signed)
Pt called for 2nd time-not in lobby

## 2014-01-16 NOTE — MAU Note (Signed)
Patient called for the third time-not in the lobby

## 2014-01-16 NOTE — MAU Note (Signed)
Pt called and not in the lobby.

## 2014-01-16 NOTE — MAU Note (Signed)
Pt presents to MAU with complaints of feeling lightheaded and dizzy today while she was sitting out in the sun. Denies any vaginal bleeding or LOF

## 2014-01-17 ENCOUNTER — Ambulatory Visit (INDEPENDENT_AMBULATORY_CARE_PROVIDER_SITE_OTHER): Payer: Medicaid Other | Admitting: Family Medicine

## 2014-01-17 ENCOUNTER — Inpatient Hospital Stay (HOSPITAL_COMMUNITY)
Admission: AD | Admit: 2014-01-17 | Discharge: 2014-01-19 | DRG: 775 | Disposition: A | Discharge status: Home / Self Care | Payer: Medicaid Other | Source: Ambulatory Visit | Attending: Obstetrics & Gynecology | Admitting: Obstetrics & Gynecology

## 2014-01-17 ENCOUNTER — Encounter (HOSPITAL_COMMUNITY): Payer: Self-pay | Admitting: *Deleted

## 2014-01-17 VITALS — BP 117/82 | HR 75 | Temp 98.1°F | Wt 136.9 lb

## 2014-01-17 DIAGNOSIS — O09529 Supervision of elderly multigravida, unspecified trimester: Secondary | ICD-10-CM | POA: Diagnosis present

## 2014-01-17 DIAGNOSIS — O99334 Smoking (tobacco) complicating childbirth: Secondary | ICD-10-CM | POA: Diagnosis present

## 2014-01-17 DIAGNOSIS — IMO0001 Reserved for inherently not codable concepts without codable children: Secondary | ICD-10-CM

## 2014-01-17 DIAGNOSIS — F17200 Nicotine dependence, unspecified, uncomplicated: Secondary | ICD-10-CM | POA: Diagnosis not present

## 2014-01-17 DIAGNOSIS — F1920 Other psychoactive substance dependence, uncomplicated: Secondary | ICD-10-CM | POA: Diagnosis not present

## 2014-01-17 DIAGNOSIS — Z3490 Encounter for supervision of normal pregnancy, unspecified, unspecified trimester: Secondary | ICD-10-CM

## 2014-01-17 DIAGNOSIS — Z3480 Encounter for supervision of other normal pregnancy, unspecified trimester: Secondary | ICD-10-CM

## 2014-01-17 DIAGNOSIS — O99344 Other mental disorders complicating childbirth: Secondary | ICD-10-CM | POA: Diagnosis present

## 2014-01-17 DIAGNOSIS — O99320 Drug use complicating pregnancy, unspecified trimester: Secondary | ICD-10-CM

## 2014-01-17 DIAGNOSIS — O479 False labor, unspecified: Secondary | ICD-10-CM | POA: Diagnosis present

## 2014-01-17 DIAGNOSIS — Z3483 Encounter for supervision of other normal pregnancy, third trimester: Secondary | ICD-10-CM

## 2014-01-17 DIAGNOSIS — F1210 Cannabis abuse, uncomplicated: Secondary | ICD-10-CM | POA: Diagnosis present

## 2014-01-17 DIAGNOSIS — O99324 Drug use complicating childbirth: Secondary | ICD-10-CM | POA: Diagnosis not present

## 2014-01-17 LAB — RAPID URINE DRUG SCREEN, HOSP PERFORMED
Amphetamines: NOT DETECTED
BARBITURATES: NOT DETECTED
Benzodiazepines: NOT DETECTED
Cocaine: NOT DETECTED
Opiates: NOT DETECTED
Tetrahydrocannabinol: POSITIVE — AB

## 2014-01-17 LAB — CBC
HCT: 31 % — ABNORMAL LOW (ref 36.0–46.0)
Hemoglobin: 10.8 g/dL — ABNORMAL LOW (ref 12.0–15.0)
MCH: 35.8 pg — ABNORMAL HIGH (ref 26.0–34.0)
MCHC: 34.8 g/dL (ref 30.0–36.0)
MCV: 102.6 fL — AB (ref 78.0–100.0)
Platelets: 199 10*3/uL (ref 150–400)
RBC: 3.02 MIL/uL — ABNORMAL LOW (ref 3.87–5.11)
RDW: 14.1 % (ref 11.5–15.5)
WBC: 16 10*3/uL — AB (ref 4.0–10.5)

## 2014-01-17 LAB — POCT URINALYSIS DIP (DEVICE)
Bilirubin Urine: NEGATIVE
Glucose, UA: NEGATIVE mg/dL
Ketones, ur: NEGATIVE mg/dL
NITRITE: NEGATIVE
PH: 7 (ref 5.0–8.0)
PROTEIN: NEGATIVE mg/dL
Specific Gravity, Urine: 1.015 (ref 1.005–1.030)
Urobilinogen, UA: 0.2 mg/dL (ref 0.0–1.0)

## 2014-01-17 MED ORDER — OXYTOCIN BOLUS FROM INFUSION
500.0000 mL | INTRAVENOUS | Status: DC
  Administered 2014-01-18: 500 mL via INTRAVENOUS

## 2014-01-17 MED ORDER — LIDOCAINE HCL (PF) 1 % IJ SOLN
30.0000 mL | INTRAMUSCULAR | Status: DC | PRN
  Filled 2014-01-17: qty 30

## 2014-01-17 MED ORDER — ACETAMINOPHEN 325 MG PO TABS: 650.0000 mg | ORAL_TABLET | ORAL | Status: DC | PRN

## 2014-01-17 MED ORDER — FENTANYL CITRATE 0.05 MG/ML IJ SOLN
100.0000 ug | INTRAMUSCULAR | Status: DC | PRN
  Administered 2014-01-17: 100 ug via INTRAVENOUS
  Filled 2014-01-17: qty 2

## 2014-01-17 MED ORDER — ONDANSETRON HCL 4 MG/2ML IJ SOLN: 4.0000 mg | Freq: Four times a day (QID) | INTRAMUSCULAR | Status: DC | PRN

## 2014-01-17 MED ORDER — LACTATED RINGERS IV SOLN: 500.0000 mL | INTRAVENOUS | Status: DC | PRN

## 2014-01-17 MED ORDER — FENTANYL CITRATE 0.05 MG/ML IJ SOLN
50.0000 ug | INTRAMUSCULAR | Status: DC | PRN
  Administered 2014-01-17: 50 ug via INTRAVENOUS
  Filled 2014-01-17: qty 2

## 2014-01-17 MED ORDER — OXYTOCIN 40 UNITS IN LACTATED RINGERS INFUSION - SIMPLE MED
62.5000 mL/h | INTRAVENOUS | Status: DC
  Filled 2014-01-17: qty 1000

## 2014-01-17 MED ORDER — FLEET ENEMA 7-19 GM/118ML RE ENEM: 1.0000 | ENEMA | Freq: Every day | RECTAL | Status: DC | PRN

## 2014-01-17 MED ORDER — IBUPROFEN 600 MG PO TABS
600.0000 mg | ORAL_TABLET | Freq: Four times a day (QID) | ORAL | Status: DC | PRN
  Administered 2014-01-18: 600 mg via ORAL
  Filled 2014-01-17: qty 1

## 2014-01-17 MED ORDER — CITRIC ACID-SODIUM CITRATE 334-500 MG/5ML PO SOLN: 30.0000 mL | ORAL | Status: DC | PRN

## 2014-01-17 MED ORDER — OXYCODONE-ACETAMINOPHEN 5-325 MG PO TABS: 1.0000 | ORAL_TABLET | ORAL | Status: DC | PRN

## 2014-01-17 MED ORDER — LACTATED RINGERS IV SOLN
INTRAVENOUS | Status: DC
  Administered 2014-01-17: 20:00:00 via INTRAVENOUS

## 2014-01-17 NOTE — Progress Notes (Signed)
C/o of contractions, becoming more regular since yesterday, 10-58min apart. Believes she lost her mucus plug last night-- mucousy, brownish/blood discharge.

## 2014-01-17 NOTE — Patient Instructions (Signed)

## 2014-01-17 NOTE — H&P (Signed)
Cindy Hernandez is a 38 y.o. female [phone] with IUP at [redacted]w[redacted]d presenting for active labor. Pt states she has been having regular, every 3-5 minutes contractions, associated with scant staining vaginal bleeding.  Of note, patient woke up and had a reddish/brown vaginal d/c which she felt was her bloody show. She had a outpt appt this AM when they stripped her membranes.  Membranes are intact, with active fetal movement.     [redacted]w[redacted]d Transvaginal U/S EDD 01/25/14  Prenatal History/Complications: [facility] at [facility] since [redacted]w[redacted]d; uncomplicated course   Past Medical History: Past Medical History  Diagnosis Date  . Dysfunctional uterine bleeding     Past Surgical History: Past Surgical History  Procedure Laterality Date  . Dilation and curettage of uterus    . Wisdom tooth extraction      Obstetrical History: OB History   Grav Para Term Preterm Abortions TAB SAB Ect Mult Living   3 2 2  0 0 0 0 0 0 2      Social History: History   Social History  . Marital Status: Single    Spouse Name: N/A    Number of Children: N/A  . Years of Education: N/A   Social History Main Topics  . Smoking status: Current Every Day Smoker -- 0.50 packs/day for 20 years    Types: Cigarettes  . Smokeless tobacco: Not on file  . Alcohol Use: No  . Drug Use: Yes    Special: Marijuana  . Sexual Activity: Yes    Birth Control/ Protection: None   Other Topics Concern  . Not on file   Social History Narrative  . No narrative on file   Last smoke marijuana last night due to pain. Hasn't done cocaine since she found out she was pregnant  Family History: Family History  Problem Relation Age of Onset  . Cancer Sister     cervical    Allergies: Allergies  Allergen Reactions  . Flagyl [Metronidazole] Nausea And Vomiting    Prescriptions prior to admission  Medication Sig Dispense Refill  . calcium carbonate (TUMS - DOSED IN MG ELEMENTAL CALCIUM) 500 MG chewable tablet Chew 3 tablets by mouth 2 (two)  times daily as needed for indigestion or heartburn.      . flintstones complete (FLINTSTONES) 60 MG chewable tablet Chew 3 tablets by mouth daily.      . polyethylene glycol (MIRALAX) packet Take 17 g by mouth daily.  14 each  1     Review of Systems   Constitutional: No fever, chills, or fatigue  Blood pressure 125/72, pulse 79, temperature 98.4 F (36.9 C), temperature source Oral, resp. rate 20, last menstrual period 04/19/2013. General appearance: alert, cooperative and no distress Lungs: clear to auscultation bilaterally Heart: regular rate and rhythm Abdomen: soft, non-tender; bowel sounds normal Extremities: Homans sign is negative, no sign of DVT DTR's 2+ Presentation: unsure Fetal monitoringBaseline: 130 bpm, Variability: Good {> 6 bpm), Accelerations: Reactive and Decelerations: Absent Uterine activityFrequency: Every 2-4 minutes Dilation: 5 Effacement (%): 90 Station: -2 Exam by:: m wilkins rnc   Prenatal labs: ABO, Rh: A/POS/-- (01/27 1413) Antibody: NEG (01/27 1413) Rubella:   Not noted RPR: NON REAC (06/17 1631)  HBsAg: NEGATIVE (01/27 1413)  HIV: NONREACTIVE (06/17 1631)  GBS: Negative (07/20 0000)  1 hr Glucola 103 Genetic screening  Not noted Anatomy US Normal   Prenatal Transfer Tool  Maternal Diabetes: No Genetic Screening: Declined Maternal Ultrasounds/Referrals: Normal Fetal Ultrasounds or other Referrals:  None Maternal  Substance Abuse:  Yes:  Type: Smoker, Marijuana, Cocaine Significant Maternal Medications:  None Significant Maternal Lab Results: Lab values include: Group B Strep negative     Results for orders placed in visit on 01/17/14 (from the past 24 hour(s))  POCT URINALYSIS DIP (DEVICE)   Collection Time    01/17/14 10:45 AM      Result Value Ref Range   Glucose, UA NEGATIVE  NEGATIVE mg/dL   Bilirubin Urine NEGATIVE  NEGATIVE   Ketones, ur NEGATIVE  NEGATIVE mg/dL   Specific Gravity, Urine 1.015  1.005 - 1.030   Hgb urine  dipstick TRACE (*) NEGATIVE   pH 7.0  5.0 - 8.0   Protein, ur NEGATIVE  NEGATIVE mg/dL   Urobilinogen, UA 0.2  0.0 - 1.0 mg/dL   Nitrite NEGATIVE  NEGATIVE   Leukocytes, UA SMALL (*) NEGATIVE    Assessment: Cindy Hernandez is a 38 y.o. G3P2002 at 104w0d by [redacted]w[redacted]d U/S found to be in active labor #Labor:Progress naturally, will not likely need augmentation  #Pain: Fentanyl #FWB: Cat 1  #ID:  GBS neg #MOF: Breast and bottle #MOC:BTL, note says paper signed on 6/17- need to locate #Circ:  Desires as an outpatient   Archie Patten 01/17/2014, 7:16 PM  Evaluation and management procedures were performed by Resident physician under my supervision/collaboration. Chart reviewed, patient examined by me and I agree with management and plan.

## 2014-01-17 NOTE — Progress Notes (Signed)
Patient is 38 y.o. G3P2002 [redacted]w[redacted]d.  +FM, denies LOF, VB, vaginal discharge.  Thinks she lost her mucous plug yesterday, noted contractions q21min - membranes stripped

## 2014-01-18 ENCOUNTER — Encounter (HOSPITAL_COMMUNITY): Payer: Self-pay | Admitting: General Practice

## 2014-01-18 DIAGNOSIS — IMO0001 Reserved for inherently not codable concepts without codable children: Secondary | ICD-10-CM

## 2014-01-18 DIAGNOSIS — O99324 Drug use complicating childbirth: Secondary | ICD-10-CM

## 2014-01-18 DIAGNOSIS — F17200 Nicotine dependence, unspecified, uncomplicated: Secondary | ICD-10-CM

## 2014-01-18 DIAGNOSIS — F1920 Other psychoactive substance dependence, uncomplicated: Secondary | ICD-10-CM

## 2014-01-18 LAB — RPR

## 2014-01-18 MED ORDER — BENZOCAINE-MENTHOL 20-0.5 % EX AERO: 1.0000 "application " | INHALATION_SPRAY | CUTANEOUS | Status: DC | PRN

## 2014-01-18 MED ORDER — LACTATED RINGERS IV SOLN: INTRAVENOUS | Status: DC

## 2014-01-18 MED ORDER — OXYTOCIN 40 UNITS IN LACTATED RINGERS INFUSION - SIMPLE MED: 62.5000 mL/h | INTRAVENOUS | Status: DC | PRN

## 2014-01-18 MED ORDER — FLEET ENEMA 7-19 GM/118ML RE ENEM: 1.0000 | ENEMA | Freq: Every day | RECTAL | Status: DC | PRN

## 2014-01-18 MED ORDER — FAMOTIDINE 20 MG PO TABS: 40.0000 mg | ORAL_TABLET | Freq: Once | ORAL | Status: DC

## 2014-01-18 MED ORDER — DIBUCAINE 1 % RE OINT: 1.0000 "application " | TOPICAL_OINTMENT | RECTAL | Status: DC | PRN

## 2014-01-18 MED ORDER — MEASLES, MUMPS & RUBELLA VAC ~~LOC~~ INJ
0.5000 mL | INJECTION | Freq: Once | SUBCUTANEOUS | Status: AC
Start: 2014-01-19 — End: 2014-01-19
  Administered 2014-01-19: 0.5 mL via SUBCUTANEOUS
  Filled 2014-01-18 (×2): qty 0.5

## 2014-01-18 MED ORDER — IBUPROFEN 600 MG PO TABS
600.0000 mg | ORAL_TABLET | Freq: Four times a day (QID) | ORAL | Status: DC
  Administered 2014-01-18 – 2014-01-19 (×6): 600 mg via ORAL
  Filled 2014-01-18 (×5): qty 1

## 2014-01-18 MED ORDER — SIMETHICONE 80 MG PO CHEW: 80.0000 mg | CHEWABLE_TABLET | ORAL | Status: DC | PRN

## 2014-01-18 MED ORDER — OXYCODONE-ACETAMINOPHEN 5-325 MG PO TABS
1.0000 | ORAL_TABLET | ORAL | Status: DC | PRN
Start: 2014-01-18 — End: 2014-01-19
  Administered 2014-01-18 (×3): 1 via ORAL
  Filled 2014-01-18 (×3): qty 1

## 2014-01-18 MED ORDER — METHYLERGONOVINE MALEATE 0.2 MG PO TABS: 0.2000 mg | ORAL_TABLET | ORAL | Status: DC | PRN

## 2014-01-18 MED ORDER — ONDANSETRON HCL 4 MG/2ML IJ SOLN: 4.0000 mg | INTRAMUSCULAR | Status: DC | PRN

## 2014-01-18 MED ORDER — TETANUS-DIPHTH-ACELL PERTUSSIS 5-2.5-18.5 LF-MCG/0.5 IM SUSP: 0.5000 mL | Freq: Once | INTRAMUSCULAR | Status: DC

## 2014-01-18 MED ORDER — PRENATAL MULTIVITAMIN CH
1.0000 | ORAL_TABLET | Freq: Every day | ORAL | Status: DC
  Administered 2014-01-18 – 2014-01-19 (×2): 1 via ORAL
  Filled 2014-01-18 (×2): qty 1

## 2014-01-18 MED ORDER — FERROUS SULFATE 325 (65 FE) MG PO TABS
325.0000 mg | ORAL_TABLET | Freq: Two times a day (BID) | ORAL | Status: DC
  Administered 2014-01-18 – 2014-01-19 (×3): 325 mg via ORAL
  Filled 2014-01-18 (×3): qty 1

## 2014-01-18 MED ORDER — METHYLERGONOVINE MALEATE 0.2 MG/ML IJ SOLN: 0.2000 mg | INTRAMUSCULAR | Status: DC | PRN

## 2014-01-18 MED ORDER — ZOLPIDEM TARTRATE 5 MG PO TABS: 5.0000 mg | ORAL_TABLET | Freq: Every evening | ORAL | Status: DC | PRN

## 2014-01-18 MED ORDER — WITCH HAZEL-GLYCERIN EX PADS: 1.0000 "application " | MEDICATED_PAD | CUTANEOUS | Status: DC | PRN

## 2014-01-18 MED ORDER — STERILE WATER FOR INJECTION IJ SOLN
0.4000 mL | Freq: Once | INTRAMUSCULAR | Status: AC
  Administered 2014-01-18: via INTRAMUSCULAR
  Filled 2014-01-18: qty 5

## 2014-01-18 MED ORDER — BISACODYL 10 MG RE SUPP: 10.0000 mg | Freq: Every day | RECTAL | Status: DC | PRN

## 2014-01-18 MED ORDER — SENNOSIDES-DOCUSATE SODIUM 8.6-50 MG PO TABS
2.0000 | ORAL_TABLET | ORAL | Status: DC
  Administered 2014-01-18: 2 via ORAL
  Filled 2014-01-18: qty 2

## 2014-01-18 MED ORDER — METOCLOPRAMIDE HCL 10 MG PO TABS: 10.0000 mg | ORAL_TABLET | Freq: Once | ORAL | Status: DC

## 2014-01-18 MED ORDER — DIPHENHYDRAMINE HCL 25 MG PO CAPS: 25.0000 mg | ORAL_CAPSULE | Freq: Four times a day (QID) | ORAL | Status: DC | PRN

## 2014-01-18 MED ORDER — ONDANSETRON HCL 4 MG PO TABS: 4.0000 mg | ORAL_TABLET | ORAL | Status: DC | PRN

## 2014-01-18 MED ORDER — LANOLIN HYDROUS EX OINT
TOPICAL_OINTMENT | CUTANEOUS | Status: DC | PRN
Start: 2014-01-18 — End: 2014-01-19

## 2014-01-18 NOTE — Progress Notes (Signed)
UR chart review completed.  

## 2014-01-18 NOTE — Progress Notes (Signed)
Clinical Social Work Department PSYCHOSOCIAL ASSESSMENT - MATERNAL/CHILD 01/18/2014  Patient:  Cindy Hernandez,Cindy Hernandez  Account Number:  [id number]  Admit Date:  01/17/2014  Childs Name:   Cindy Hernandez    Clinical Social Worker:  HOLLY GERBER, LCSW   Date/Time:  01/18/2014 12:00 N  Date Referred:  01/18/2014   Referral source  Physician     Referred reason  Substance Abuse   Other referral source:    I:  FAMILY / HOME ENVIRONMENT Child's legal guardian:  PARENT  Guardian - Name Guardian - Age Guardian - Address  Cindy Hernandez 38 [address] [place], [place] [address]   Other household support members/support persons Name Relationship DOB  Alice Rankin MOTHER    SON 20 year old   SON 18 year old   Other support:   FOB ([place]) is involved but lives out of town.    II  PSYCHOSOCIAL DATA Information Source:  Patient Interview  Financial and Community Resources Employment:   Unemployed   Financial resources:  Medicaid If Medicaid - County:  [place] Other  WIC  Food Stamps   School / Grade:   Maternity Care Coordinator / Child Services Coordination / Early Interventions:   MOB reports she received prenatal care during pregnancy but did not take any birthing/parenting classes because she felt prepared.  Cultural issues impacting care:   None reported    III  STRENGTHS Strengths  Home prepared for Child (including basic supplies)  Supportive family/friends   Strength comment:  MOB reports all adequate resources and ready to receive baby at home. MOB reports that her mother is involved and helpful.   IV  RISK FACTORS AND CURRENT PROBLEMS Current Problem:  YES   Risk Factor & Current Problem Patient Issue Family Issue Risk Factor / Current Problem Comment  Substance Abuse Y N MOB admits to smoking marijuana throughout pregnancy    V  SOCIAL WORK ASSESSMENT CSW received referral due to MOB reporting using marijuana during pregnancy. CSW reviewed chart and  spoke with bedside RN. Per chart review, MOB reported using marijuana on day of admission and using cocaine during pregnancy. CSW met with MOB at bedside while no visitors were present.    MOB reports that this is her third child but her other children are older. MOB's 18 yo and 20 yo live with her and they all reside with MOB's mother. MOB reports that FOB is involved but that he lives out of town so she is unsure how much support he can provide. MOB reports all of her extended family live in [place] and she has all necessary supplies so she is not concerned about returning home. MOB reports she has already alerted Medicaid, WIC, and food stamps of baby's arrival.    MOB reports that she wanted to know why CSW had to talk with her since she was honest about her substance use. MOB reports that she needed to smoke THC because she was in chronic pain during pregnancy. MOB denies any emotional connection to using THC and states that she could quit any time she wanted to. MOB reports that her nephew was killed last week but she did not use THC to handle stress but more so due to pain. MOB reports that she thinks "its normal to smoke pot" and feels that everyone does it but does not admit to it. MOB reports she has no plans to use THC at DC because she is no longer in pain now that baby has been born.   CSW explained drug screen that would occur on baby and follow up with CPS if positive screen occurred. MOB reports understanding and reports that baby will most likely test positive due to recent THC use. MOB denies any cocaine use throughout pregnancy.    MOB declined any community resources for follow up and reports she is confident that she can care for baby since she has raised two children already.      VI SOCIAL WORK PLAN Social Work Plan  No Further Intervention Required / No Barriers to Discharge   Type of pt/family education:   Drug screen of baby   If child protective services report - county:    If child protective services report - date:   Information/referral to community resources comment:   MOB declined all resources   Other social work plan:   No barriers to DC. CSW will continue to follow for drug screen and make CPS report if positive. UDS negative.     Holly Gerber, LCSW (Coverage for Sarah Venning) 

## 2014-01-18 NOTE — Progress Notes (Signed)
   KALYANI MAEDA is a 38 y.o. [id number] at [redacted]w[redacted]d  admitted for active labor  Subjective: Contractions are very strong.  Does not want an epidural.  Feels them in her lower back  Objective: Filed Vitals:   01/17/14 1900 01/17/14 1901  BP:  125/72  Pulse:  79  Temp: 98.4 F (36.9 C)   TempSrc: Oral   Resp:  20  Height:  5\' 3"  (1.6 m)  Weight:  63.05 kg (139 lb)      FHT:  FHR: 130 bpm, variability: moderate,  accelerations:  Present,  decelerations:  Absent UC:   regular, every 2 minutes SVE:   Dilation: 8 Effacement (%): 80 Station: 0 Exam by:: Parke Simmers, RN Baby OP  Labs: Lab Results  Component Value Date   WBC 16.0* 01/17/2014   HGB 10.8* 01/17/2014   HCT 31.0* 01/17/2014   MCV 102.6* 01/17/2014   PLT 199 01/17/2014    Assessment / Plan: Spontaneous labor, progressing normally  Labor: Progressing normally Fetal Wellbeing:  Category I Pain Control:  Fentanyl will try sterile water injections Anticipated MOD:  NSVD  CRESENZO-DISHMAN,Janisa Labus 01/18/2014, 12:11 AM

## 2014-01-18 NOTE — Lactation Note (Signed)
This note was copied from the chart of [place]. Lactation Consultation Note  Patient Name: Cindy Hernandez XJOIT'G Date: 01/18/2014 Reason for consult: Initial assessment Baby 15 hours of life. Mom states that she intends to breastfeed for 3-6 months. Discussed with mom that offering bottles interferes with successful breastfeeding, due to lowered demand and therefore supply at breast. Enc mom to offer lots of STS, and breastfeed at least 8-12 times/24 hours. Discussed with mom that "clean" breastmilk is best for baby, meaning it is best not to smoke at all. Mom states that she plans to smoke just after nursing. Also enc mom to keep cigarette smoke away from baby. Discussed with mom that it is not recommended to use Marijuana and breastfeed. Mom also given reference article from [place] and [employer] addressing the risks of Marijuana use while breastfeeding.  Baby actively cueing to nurse, offered to assist mom to latch baby. Mom states that she knows how to hand express and has seen colostrum, and is about to shower before nursing again. Mom given [facility] brochure, aware of OP/BFSG and community resources.   Maternal Data Formula Feeding for Exclusion: Yes Reason for exclusion: Mother's choice to formula and breast feed on admission Has patient been taught Hand Expression?: Yes Does the patient have breastfeeding experience prior to this delivery?: No  Feeding Feeding Type:  (Baby is cueing to feed, but mom wants to take a shower first. Enc to nurse soon.) Nipple Type: Regular  LATCH Score/Interventions                      Lactation Tools Discussed/Used     Consult Status Consult Status: Follow-up Date: 01/19/14 Follow-up type: In-patient    Inocente Salles 01/18/2014, 4:50 PM

## 2014-01-19 ENCOUNTER — Inpatient Hospital Stay (HOSPITAL_COMMUNITY): Payer: Medicaid Other

## 2014-01-19 ENCOUNTER — Encounter (HOSPITAL_COMMUNITY)
Admission: AD | Disposition: A | Discharge status: Home / Self Care | Payer: Self-pay | Source: Ambulatory Visit | Attending: Obstetrics & Gynecology

## 2014-01-19 ENCOUNTER — Encounter (HOSPITAL_COMMUNITY): Payer: Medicaid Other

## 2014-01-19 SURGERY — LIGATION, FALLOPIAN TUBE, POSTPARTUM
Anesthesia: General

## 2014-01-19 MED ORDER — IBUPROFEN 600 MG PO TABS: 600.0000 mg | ORAL_TABLET | Freq: Four times a day (QID) | ORAL | Status: DC

## 2014-01-19 MED ORDER — PNEUMOCOCCAL VAC POLYVALENT 25 MCG/0.5ML IJ INJ
0.5000 mL | INJECTION | INTRAMUSCULAR | Status: AC
  Administered 2014-01-19: 0.5 mL via INTRAMUSCULAR
  Filled 2014-01-19: qty 0.5

## 2014-01-19 SURGICAL SUPPLY — 28 items
ADH SKN CLS APL DERMABOND .7 (GAUZE/BANDAGES/DRESSINGS)
CLOTH BEACON ORANGE TIMEOUT ST (SAFETY) ×1 IMPLANT
CONTAINER PREFILL 10% NBF 15ML (MISCELLANEOUS) ×2 IMPLANT
DERMABOND ADVANCED (GAUZE/BANDAGES/DRESSINGS)
DERMABOND ADVANCED .7 DNX12 (GAUZE/BANDAGES/DRESSINGS) ×1 IMPLANT
DRSG OPSITE POSTOP 3X4 (GAUZE/BANDAGES/DRESSINGS) ×1 IMPLANT
ELECT REM PT RETURN 9FT ADLT (ELECTROSURGICAL)
ELECTRODE REM PT RTRN 9FT ADLT (ELECTROSURGICAL) ×1 IMPLANT
GLOVE BIOGEL PI IND STRL 8 (GLOVE) ×1 IMPLANT
GLOVE BIOGEL PI INDICATOR 8 (GLOVE)
GLOVE ECLIPSE 8.0 STRL XLNG CF (GLOVE) ×1 IMPLANT
GOWN STRL REUS W/TWL LRG LVL3 (GOWN DISPOSABLE) ×2 IMPLANT
NDL HYPO 25X1 1.5 SAFETY (NEEDLE) ×1 IMPLANT
NEEDLE HYPO 25X1 1.5 SAFETY (NEEDLE) IMPLANT
NS IRRIG 1000ML POUR BTL (IV SOLUTION) ×1 IMPLANT
PACK ABDOMINAL MINOR (CUSTOM PROCEDURE TRAY) ×1 IMPLANT
PENCIL BUTTON HOLSTER BLD 10FT (ELECTRODE) ×1 IMPLANT
SPONGE LAP 4X18 X RAY DECT (DISPOSABLE) IMPLANT
SUT PLAIN 2 0 (SUTURE)
SUT PLAIN ABS 2-0 CT1 27XMFL (SUTURE) ×2 IMPLANT
SUT VIC AB 0 CT1 27 (SUTURE)
SUT VIC AB 0 CT1 27XBRD ANBCTR (SUTURE) ×1 IMPLANT
SUT VIC AB 4-0 KS 27 (SUTURE) ×1 IMPLANT
SYRINGE CONTROL L 12CC (SYRINGE) IMPLANT
SYRINGE CONTROL LL 12CC (SYRINGE) ×1 IMPLANT
TOWEL OR 17X24 6PK STRL BLUE (TOWEL DISPOSABLE) ×2 IMPLANT
TRAY FOLEY CATH 14FR (SET/KITS/TRAYS/PACK) ×1 IMPLANT
WATER STERILE IRR 1000ML POUR (IV SOLUTION) ×1 IMPLANT

## 2014-01-19 NOTE — Anesthesia Preprocedure Evaluation (Deleted)
Anesthesia Evaluation  Patient identified by MRN, date of birth, ID band Patient awake    Reviewed: Allergy & Precautions, H&P , NPO status , Patient's Chart, lab work & pertinent test results  Airway Mallampati: II TM Distance: >3 FB Neck ROM: Full    Dental no notable dental hx.    Pulmonary neg pulmonary ROS, Current Smoker,  breath sounds clear to auscultation  Pulmonary exam normal       Cardiovascular negative cardio ROS  Rhythm:Regular Rate:Normal     Neuro/Psych negative neurological ROS  negative psych ROS   GI/Hepatic negative GI ROS, Neg liver ROS,   Endo/Other  negative endocrine ROS  Renal/GU negative Renal ROS     Musculoskeletal negative musculoskeletal ROS (+)   Abdominal   Peds  Hematology negative hematology ROS (+)   Anesthesia Other Findings   Reproductive/Obstetrics negative OB ROS                          Anesthesia Physical Anesthesia Plan  ASA: II  Anesthesia Plan: General   Post-op Pain Management:    Induction: Intravenous  Airway Management Planned: Oral ETT  Additional Equipment:   Intra-op Plan:   Post-operative Plan: Extubation in OR  Informed Consent: I have reviewed the patients History and Physical, chart, labs and discussed the procedure including the risks, benefits and alternatives for the proposed anesthesia with the patient or authorized representative who has indicated his/her understanding and acceptance.   Dental advisory given  Plan Discussed with: CRNA  Anesthesia Plan Comments:         Anesthesia Quick Evaluation

## 2014-01-19 NOTE — Discharge Summary (Signed)
Obstetric Discharge Summary Reason for Admission: onset of labor Prenatal Procedures: none Intrapartum Procedures: spontaneous vaginal delivery Postpartum Procedures: none Complications-Operative and Postpartum: none Hemoglobin  Date Value Ref Range Status  01/17/2014 10.8* 12.0 - 15.0 g/dL Final     HCT  Date Value Ref Range Status  01/17/2014 31.0* 36.0 - 46.0 % Final   Cindy Hernandez is a 38yo G3P2002 admitted on the evening of 8/20 @ 39wks in active labor. Her prenatal care had been provided by the [facility] and has been remarkable for + cocaine/THC in initial UDS. (She also planned for a BTL PP, but an epidural was unable to be placed during labor and the pt did not desire to go through that process again in the form of a spinal.)  She progressed normally to SVD shortly after midnight on 8/21. By PPD#1 she is doing well and is deemed to have received the full benefit of her hospital stay. A msg has been sent to the clinic to schedule a 2-3 week pre-op/PP visit to plan for her interval BTL. Pt's UDS + for THC this admission. Cleared for d/c by SW and will follow up re infant drug screen prn.  Physical Exam:  General: alert, cooperative and no distress Heart: RRR Lungs: nl effort Lochia: appropriate Uterine Fundus: firm DVT Evaluation: No evidence of DVT seen on physical exam.  Discharge Diagnoses: Term Pregnancy-delivered  Discharge Information: Date: 01/19/2014 Activity: pelvic rest Diet: routine Medications: PNV and Ibuprofen Condition: stable Instructions: refer to practice specific booklet Discharge to: home Follow-up Information   Follow up with [facility] In 3 weeks. (You will be contacted about a pre-op visit with a doctor. At that time your surgery will be scheduled to be in approximately 6 weeks postpartum.)    Contact information:   [place] [address] [phone]      Newborn Data: Live born female  Birth Weight: 6 lb 5.6 oz  (2880 g) APGAR: 8, 9  Home with mother.  [name] CNM 01/19/2014, 8:58 AM

## 2014-01-19 NOTE — Discharge Instructions (Signed)

## 2014-01-25 ENCOUNTER — Encounter: Payer: Medicaid Other | Admitting: Obstetrics and Gynecology

## 2014-01-31 ENCOUNTER — Telehealth: Payer: Self-pay | Admitting: General Practice

## 2014-01-31 NOTE — Telephone Encounter (Signed)
Patient called and left message stating she had a baby 2 weeks ago and her blood doesn't look like and her bones hurt and there's also an odor present. Called patient back and she states that the blood has turned brown but it smells like sewage is coming out of her and her bones ache and hurt in her sides, thigh and back. Patient denies fever. Recommended to patient she go to MAU for further evaluation to ensure she doesn't have an infection present. Patient verbalized understanding and had no other questions

## 2014-02-02 ENCOUNTER — Encounter (HOSPITAL_COMMUNITY): Payer: Self-pay | Admitting: *Deleted

## 2014-02-02 ENCOUNTER — Inpatient Hospital Stay (HOSPITAL_COMMUNITY)
Admission: AD | Admit: 2014-02-02 | Discharge: 2014-02-02 | Disposition: A | Discharge status: Home / Self Care | Payer: Medicaid Other | Source: Ambulatory Visit | Attending: Obstetrics & Gynecology | Admitting: Obstetrics & Gynecology

## 2014-02-02 DIAGNOSIS — F17200 Nicotine dependence, unspecified, uncomplicated: Secondary | ICD-10-CM | POA: Diagnosis not present

## 2014-02-02 DIAGNOSIS — O8612 Endometritis following delivery: Secondary | ICD-10-CM

## 2014-02-02 DIAGNOSIS — O99893 Other specified diseases and conditions complicating puerperium: Secondary | ICD-10-CM | POA: Diagnosis not present

## 2014-02-02 DIAGNOSIS — R109 Unspecified abdominal pain: Secondary | ICD-10-CM | POA: Insufficient documentation

## 2014-02-02 DIAGNOSIS — O9989 Other specified diseases and conditions complicating pregnancy, childbirth and the puerperium: Principal | ICD-10-CM

## 2014-02-02 DIAGNOSIS — N898 Other specified noninflammatory disorders of vagina: Secondary | ICD-10-CM | POA: Diagnosis present

## 2014-02-02 MED ORDER — AMOXICILLIN-POT CLAVULANATE 875-125 MG PO TABS: 1.0000 | ORAL_TABLET | Freq: Two times a day (BID) | ORAL | Status: DC

## 2014-02-02 NOTE — MAU Note (Signed)
Pt is 3 weeks post partum. Stated her  Discharge/bleeding has gotten dark and has an odor. C/o lower abd pain since she had baby.

## 2014-02-02 NOTE — MAU Provider Note (Signed)
  History     CSN: [id number]  Arrival date and time: 02/02/14 2536   First Provider Initiated Contact with Patient 02/02/14 0102      Chief Complaint  Patient presents with  . Vaginal Discharge   HPI This is a 38 y.o. female who is 2 weeks post SVD with no complications, presents with c/o lower abdominal pain, low back pain and brown odiferous discharge. States pain has been present since delivery and is not relieved by ibuprofen (takes 2).  Denies fever. States lochia has not increased in amount but is concerned over the odor.   RN Note:  Pt is 3 weeks post partum. Stated her Discharge/bleeding has gotten dark and has an odor. C/o lower abd pain since she had baby.       OB History   Grav Para Term Preterm Abortions TAB SAB Ect Mult Living   3 3 3  0 0 0 0 0 0 3      Past Medical History  Diagnosis Date  . Dysfunctional uterine bleeding     Past Surgical History  Procedure Laterality Date  . Wisdom tooth extraction    . Dilation and curettage of uterus  2007    "to clean out infection"    Family History  Problem Relation Age of Onset  . Cancer Sister     cervical    History  Substance Use Topics  . Smoking status: Current Every Day Smoker -- 1.00 packs/day for 26 years    Types: Cigarettes  . Smokeless tobacco: Never Used  . Alcohol Use: No     Comment: cocaine use until pos preg test; Marijuana until 01/16/14    Allergies:  Allergies  Allergen Reactions  . Flagyl [Metronidazole] Nausea And Vomiting    Prescriptions prior to admission  Medication Sig Dispense Refill  . ibuprofen (ADVIL,MOTRIN) 600 MG tablet Take 1 tablet (600 mg total) by mouth every 6 (six) hours.  30 tablet  0    Review of Systems  Constitutional: Negative for fever, chills and malaise/fatigue.  Gastrointestinal: Positive for abdominal pain. Negative for nausea, vomiting, diarrhea and constipation.  Musculoskeletal: Positive for back pain.  Neurological: Negative for headaches.    Physical Exam   Blood pressure 108/65, pulse 69, temperature 98.4 F (36.9 C), temperature source Oral, resp. rate 18, height 5\' 3"  (1.6 m), weight 54.341 kg (119 lb 12.8 oz), unknown if currently breastfeeding.  Physical Exam  Constitutional: She is oriented to person, place, and time. She appears well-developed and well-nourished. No distress.  HENT:  Head: Normocephalic.  Cardiovascular: Normal rate.   Respiratory: Effort normal.  GI: Soft. She exhibits no distension and no mass. There is tenderness (lower abdominal). There is no rebound and no guarding.  Genitourinary: Vaginal discharge (dark reddish brown, does not look purulent) found.  Uterus is very tender, somewhat boggy, lochia is small in amount   Musculoskeletal: Normal range of motion.  Neurological: She is alert and oriented to person, place, and time.  Skin: Skin is warm and dry.  Psychiatric: She has a normal mood and affect.    MAU Course  Procedures  MDM  Assessment and Plan  A:  Probable late onset endometritis      No acute fever or hemorrhage  P:  Rx Augmentin 875mg  bid x 7 days per Up to Date       Followup with postpartum appointment in 1-2 weeks.   Cindy Hernandez 02/02/2014, 1:20 AM

## 2014-02-02 NOTE — Discharge Instructions (Signed)
Endometritis Endometritis is an irritation, soreness, and swelling (inflammation) of the lining of the uterus (endometrium).  CAUSES   Bacterial infections.  Sexually transmitted infections (STIs).  Having a miscarriage or childbirth, especially after a long labor or cesarean delivery.  Certain gynecological procedures (such as dilation and curettage, hysteroscopy, or contraceptive insertion). SIGNS AND SYMPTOMS   Fever.  Lower abdominal or pelvic pain.  Abnormal vaginal discharge or bleeding.  Abdominal bloating (distention) or swelling.  General discomfort or ill feeling.  Discomfort with bowel movements. DIAGNOSIS  A physical and pelvic exam are performed. Other tests may include:  Cultures from the cervix.  Blood tests.  Examining a tissue sample of the uterine lining (endometrial biopsy).  Examining discharge under a microscope (wet prep).  Laparoscopy. TREATMENT  Antibiotic medicines are usually given. Other treatments may include:  Fluids through an IV tube inserted in your vein.  Rest. HOME CARE INSTRUCTIONS   Take over-the-counter or prescription medicines for pain, discomfort, or fever as directed by your health care provider.  Take your antibiotics as directed. Finish them even if you start to feel better.  Resume your normal diet and activities as directed or as tolerated.  Do not douche or have sexual intercourse until your health care provider says it is okay.  Do not have sexual intercourse until your partner has been treated if your endometritis is caused by an STI. SEEK IMMEDIATE MEDICAL CARE IF:   You have swelling or increasing pain in the abdomen.  You have a fever.  You have bad smelling vaginal discharge, or you have an increased amount of discharge.  You have abnormal vaginal bleeding.  Your medicine is not helping with the pain.  You experience any problems that may be related to the medicine you are taking.  You have nausea  and vomiting, or you cannot keep foods down.  You have pain with bowel movements. MAKE SURE YOU:   Understand these instructions.  Will watch your condition.  Will get help right away if you are not doing well or get worse. Document Released: 05/11/2001 Document Revised: 01/17/2013 Document Reviewed: 12/14/2012 ExitCare Patient Information 2015 [employer]. This information is not intended to replace advice given to you by your health care provider. Make sure you discuss any questions you have with your health care provider.  

## 2014-02-03 NOTE — MAU Provider Note (Signed)
Attestation of Attending Supervision of Advanced Practitioner (CNM/NP): Evaluation and management procedures were performed by the Advanced Practitioner under my supervision and collaboration. I have reviewed the Advanced Practitioner's note and chart, and I agree with the management and plan.  Cindy Hernandez H. 4:25 PM

## 2014-02-13 ENCOUNTER — Ambulatory Visit (INDEPENDENT_AMBULATORY_CARE_PROVIDER_SITE_OTHER): Payer: Medicaid Other | Admitting: Obstetrics and Gynecology

## 2014-02-13 ENCOUNTER — Encounter: Payer: Self-pay | Admitting: Obstetrics and Gynecology

## 2014-02-13 VITALS — BP 102/72 | HR 75 | Temp 98.7°F | Resp 20 | Ht 63.5 in | Wt 120.9 lb

## 2014-02-13 DIAGNOSIS — Z3009 Encounter for other general counseling and advice on contraception: Secondary | ICD-10-CM

## 2014-02-13 NOTE — Progress Notes (Signed)
Pt has not started amoxicillin due to lack of finances.  Pt desires tubal sterilization.  She has been having unprotected intercourse since last week. Most recent episode was yesterday.

## 2014-02-13 NOTE — Progress Notes (Signed)
Patient ID: Cindy Hernandez, female   DOB: Sep 06, 1975, 38 y.o.   MRN: [id number] 38 yo G3P3 presenting today requesting tubal ligation. Patient delivered on 8/21 and opted for interval BTL at that time. Patient presents today ready to be scheduled for her surgery. Risks, benefits and permanence of the procedure were explained. Patient expressed doubt about having the procedure again. Patient is certain she does not want any more children at this time. She desired to have her tubes tied not burned, clipped or removed. Explained to the patient why this cannot be done at this point given the smaller size of her uterus and that the laparoscopic approach does not allow for that. Patient states her mother "advised her against that". Discussed use of LARCS as alternative options. Patient is contemplating IUD insertion at her postpartum visit. Patient advised to remain abstinent or to use condoms for the next 2 weeks in order to have LARC inserted. Patient has been sexually active without contraception thus far.

## 2014-02-13 NOTE — Patient Instructions (Signed)
Levonorgestrel intrauterine device (IUD) What is this medicine? LEVONORGESTREL IUD (LEE voe nor jes trel) is a contraceptive (birth control) device. The device is placed inside the uterus by a healthcare professional. It is used to prevent pregnancy and can also be used to treat heavy bleeding that occurs during your period. Depending on the device, it can be used for 3 to 5 years. This medicine may be used for other purposes; ask your health care provider or pharmacist if you have questions. COMMON BRAND NAME(S): LILETTA, Mirena, Skyla What should I tell my health care provider before I take this medicine? They need to know if you have any of these conditions: -abnormal Pap smear -cancer of the breast, uterus, or cervix -diabetes -endometritis -genital or pelvic infection now or in the past -have more than one sexual partner or your partner has more than one partner -heart disease -history of an ectopic or tubal pregnancy -immune system problems -IUD in place -liver disease or tumor -problems with blood clots or take blood-thinners -use intravenous drugs -uterus of unusual shape -vaginal bleeding that has not been explained -an unusual or allergic reaction to levonorgestrel, other hormones, silicone, or polyethylene, medicines, foods, dyes, or preservatives -pregnant or trying to get pregnant -breast-feeding How should I use this medicine? This device is placed inside the uterus by a health care professional. Talk to your pediatrician regarding the use of this medicine in children. Special care may be needed. Overdosage: If you think you have taken too much of this medicine contact a poison control center or emergency room at once. NOTE: This medicine is only for you. Do not share this medicine with others. What if I miss a dose? This does not apply. What may interact with this medicine? Do not take this medicine with any of the following  medications: -amprenavir -bosentan -fosamprenavir This medicine may also interact with the following medications: -aprepitant -barbiturate medicines for inducing sleep or treating seizures -bexarotene -griseofulvin -medicines to treat seizures like carbamazepine, ethotoin, felbamate, oxcarbazepine, phenytoin, topiramate -modafinil -pioglitazone -rifabutin -rifampin -rifapentine -some medicines to treat HIV infection like atazanavir, indinavir, lopinavir, nelfinavir, tipranavir, ritonavir -St. John's wort -warfarin This list may not describe all possible interactions. Give your health care provider a list of all the medicines, herbs, non-prescription drugs, or dietary supplements you use. Also tell them if you smoke, drink alcohol, or use illegal drugs. Some items may interact with your medicine. What should I watch for while using this medicine? Visit your doctor or health care professional for regular check ups. See your doctor if you or your partner has sexual contact with others, becomes HIV positive, or gets a sexual transmitted disease. This product does not protect you against HIV infection (AIDS) or other sexually transmitted diseases. You can check the placement of the IUD yourself by reaching up to the top of your vagina with clean fingers to feel the threads. Do not pull on the threads. It is a good habit to check placement after each menstrual period. Call your doctor right away if you feel more of the IUD than just the threads or if you cannot feel the threads at all. The IUD may come out by itself. You may become pregnant if the device comes out. If you notice that the IUD has come out use a backup birth control method like condoms and call your health care provider. Using tampons will not change the position of the IUD and are okay to use during your period. What side effects may   I notice from receiving this medicine? Side effects that you should report to your doctor or  health care professional as soon as possible: -allergic reactions like skin rash, itching or hives, swelling of the face, lips, or tongue -fever, flu-like symptoms -genital sores -high blood pressure -no menstrual period for 6 weeks during use -pain, swelling, warmth in the leg -pelvic pain or tenderness -severe or sudden headache -signs of pregnancy -stomach cramping -sudden shortness of breath -trouble with balance, talking, or walking -unusual vaginal bleeding, discharge -yellowing of the eyes or skin Side effects that usually do not require medical attention (report to your doctor or health care professional if they continue or are bothersome): -acne -breast pain -change in sex drive or performance -changes in weight -cramping, dizziness, or faintness while the device is being inserted -headache -irregular menstrual bleeding within first 3 to 6 months of use -nausea This list may not describe all possible side effects. Call your doctor for medical advice about side effects. You may report side effects to FDA at 1-800-FDA-1088. Where should I keep my medicine? This does not apply. NOTE: This sheet is a summary. It may not cover all possible information. If you have questions about this medicine, talk to your doctor, pharmacist, or health care provider.  2015, Elsevier/Gold Standard. (2011-06-17 13:54:04)  

## 2014-02-28 ENCOUNTER — Ambulatory Visit (INDEPENDENT_AMBULATORY_CARE_PROVIDER_SITE_OTHER): Payer: Medicaid Other | Admitting: *Deleted

## 2014-02-28 VITALS — BP 105/69 | HR 66 | Temp 97.9°F

## 2014-02-28 DIAGNOSIS — Z30013 Encounter for initial prescription of injectable contraceptive: Secondary | ICD-10-CM

## 2014-02-28 DIAGNOSIS — Z3042 Encounter for surveillance of injectable contraceptive: Secondary | ICD-10-CM

## 2014-02-28 LAB — POCT PREGNANCY, URINE: PREG TEST UR: NEGATIVE

## 2014-02-28 MED ORDER — MEDROXYPROGESTERONE ACETATE 104 MG/0.65ML ~~LOC~~ SUSP
104.0000 mg | Freq: Once | SUBCUTANEOUS | Status: AC
  Administered 2014-02-28: 104 mg via SUBCUTANEOUS

## 2014-04-01 ENCOUNTER — Encounter: Payer: Self-pay | Admitting: Obstetrics and Gynecology

## 2014-04-29 ENCOUNTER — Telehealth: Payer: Self-pay

## 2014-04-29 DIAGNOSIS — N760 Acute vaginitis: Principal | ICD-10-CM

## 2014-04-29 DIAGNOSIS — B9689 Other specified bacterial agents as the cause of diseases classified elsewhere: Secondary | ICD-10-CM

## 2014-04-29 MED ORDER — CLINDAMYCIN HCL 300 MG PO CAPS: 300.0000 mg | ORAL_CAPSULE | Freq: Two times a day (BID) | ORAL | Status: DC

## 2014-04-29 NOTE — Telephone Encounter (Signed)
Patient called stating she has a brown discharge with odor. Called patient who states discharge has a foul fishy odor-- denies any itching. Informed patient we would sent RX to pharmacy. Per Dr. Nehemiah Settle, patient can have Clindamycin 300mg  BID X 7 days as patient is allergic to Flagyl. Advised patient to call clinic if discharge does not go away or improve after treatment. Patient verbalized understanding. No questions or concerns.

## 2014-05-27 ENCOUNTER — Ambulatory Visit (INDEPENDENT_AMBULATORY_CARE_PROVIDER_SITE_OTHER): Payer: Medicaid Other

## 2014-05-27 DIAGNOSIS — N938 Other specified abnormal uterine and vaginal bleeding: Secondary | ICD-10-CM

## 2014-05-27 MED ORDER — MEDROXYPROGESTERONE ACETATE 150 MG/ML IM SUSP
150.0000 mg | Freq: Once | INTRAMUSCULAR | Status: AC
  Administered 2014-05-27: 150 mg via INTRAMUSCULAR

## 2014-05-27 NOTE — Progress Notes (Signed)
Patient here today for depo provera 104mg  injection. Patient reports bleeding since injection was given back in October. Discussed with Dr. Ihor Dow who advised to increase patient to 150mg . Depo provera 150mg  administered. Patient tolerated well. To return between 3/15-3/29.

## 2014-06-03 ENCOUNTER — Ambulatory Visit: Payer: Medicaid Other | Admitting: Medical

## 2014-06-13 ENCOUNTER — Encounter (HOSPITAL_COMMUNITY): Payer: Self-pay | Admitting: Obstetrics & Gynecology

## 2014-08-13 ENCOUNTER — Ambulatory Visit (INDEPENDENT_AMBULATORY_CARE_PROVIDER_SITE_OTHER): Payer: Medicaid Other

## 2014-08-13 VITALS — BP 103/66 | HR 67 | Wt 127.7 lb

## 2014-08-13 DIAGNOSIS — Z30013 Encounter for initial prescription of injectable contraceptive: Secondary | ICD-10-CM

## 2014-08-13 MED ORDER — MEDROXYPROGESTERONE ACETATE 150 MG/ML IM SUSP
150.0000 mg | Freq: Once | INTRAMUSCULAR | Status: AC
  Administered 2014-08-13: 150 mg via INTRAMUSCULAR

## 2014-08-14 ENCOUNTER — Ambulatory Visit: Payer: Medicaid Other

## 2014-09-11 ENCOUNTER — Emergency Department (HOSPITAL_COMMUNITY)
Admission: EM | Admit: 2014-09-11 | Discharge: 2014-09-11 | Disposition: A | Discharge status: Home / Self Care | Payer: Medicaid Other | Attending: Emergency Medicine | Admitting: Emergency Medicine

## 2014-09-11 ENCOUNTER — Encounter (HOSPITAL_COMMUNITY): Payer: Self-pay | Admitting: Family Medicine

## 2014-09-11 DIAGNOSIS — Z720 Tobacco use: Secondary | ICD-10-CM | POA: Insufficient documentation

## 2014-09-11 DIAGNOSIS — Z8742 Personal history of other diseases of the female genital tract: Secondary | ICD-10-CM | POA: Diagnosis not present

## 2014-09-11 DIAGNOSIS — Z792 Long term (current) use of antibiotics: Secondary | ICD-10-CM | POA: Insufficient documentation

## 2014-09-11 DIAGNOSIS — Z3202 Encounter for pregnancy test, result negative: Secondary | ICD-10-CM | POA: Diagnosis not present

## 2014-09-11 DIAGNOSIS — N12 Tubulo-interstitial nephritis, not specified as acute or chronic: Secondary | ICD-10-CM | POA: Diagnosis not present

## 2014-09-11 DIAGNOSIS — M549 Dorsalgia, unspecified: Secondary | ICD-10-CM | POA: Diagnosis present

## 2014-09-11 LAB — URINALYSIS, ROUTINE W REFLEX MICROSCOPIC
Bilirubin Urine: NEGATIVE
Glucose, UA: NEGATIVE mg/dL
Ketones, ur: NEGATIVE mg/dL
NITRITE: NEGATIVE
PH: 7 (ref 5.0–8.0)
Protein, ur: NEGATIVE mg/dL
Specific Gravity, Urine: 1.016 (ref 1.005–1.030)
Urobilinogen, UA: 1 mg/dL (ref 0.0–1.0)

## 2014-09-11 LAB — URINE MICROSCOPIC-ADD ON

## 2014-09-11 LAB — POC URINE PREG, ED: Preg Test, Ur: NEGATIVE

## 2014-09-11 MED ORDER — IBUPROFEN 600 MG PO TABS: 600.0000 mg | ORAL_TABLET | Freq: Four times a day (QID) | ORAL | Status: DC | PRN

## 2014-09-11 MED ORDER — ACETAMINOPHEN 500 MG PO TABS
1000.0000 mg | ORAL_TABLET | Freq: Once | ORAL | Status: AC
  Administered 2014-09-11: 1000 mg via ORAL
  Filled 2014-09-11: qty 2

## 2014-09-11 MED ORDER — SODIUM CHLORIDE 0.9 % IV BOLUS (SEPSIS)
1000.0000 mL | Freq: Once | INTRAVENOUS | Status: AC
  Administered 2014-09-11: 1000 mL via INTRAVENOUS

## 2014-09-11 MED ORDER — CEPHALEXIN 500 MG PO CAPS: 500.0000 mg | ORAL_CAPSULE | Freq: Four times a day (QID) | ORAL | Status: DC

## 2014-09-11 MED ORDER — DEXTROSE 5 % IV SOLN
1.0000 g | Freq: Once | INTRAVENOUS | Status: AC
  Administered 2014-09-11: 1 g via INTRAVENOUS
  Filled 2014-09-11: qty 10

## 2014-09-11 MED ORDER — MAGNESIUM SULFATE 2 GM/50ML IV SOLN
2.0000 g | Freq: Once | INTRAVENOUS | Status: AC
  Administered 2014-09-11: 2 g via INTRAVENOUS
  Filled 2014-09-11: qty 50

## 2014-09-11 MED ORDER — KETOROLAC TROMETHAMINE 30 MG/ML IJ SOLN
30.0000 mg | Freq: Once | INTRAMUSCULAR | Status: AC
  Administered 2014-09-11: 30 mg via INTRAVENOUS
  Filled 2014-09-11: qty 1

## 2014-09-11 MED ORDER — DIAZEPAM 5 MG PO TABS
5.0000 mg | ORAL_TABLET | Freq: Once | ORAL | Status: AC
  Administered 2014-09-11: 5 mg via ORAL
  Filled 2014-09-11: qty 1

## 2014-09-11 NOTE — ED Notes (Signed)
Pt here for 2 days of back pain, chills, headache and vaginal bleeding.

## 2014-09-11 NOTE — ED Provider Notes (Signed)
CSN: [id number]     Arrival date & time 09/11/14  3762 History   First MD Initiated Contact with Patient 09/11/14 [phone]     Chief Complaint  Patient presents with  . Back Pain  . Fever  . Headache     (Consider location/radiation/quality/duration/timing/severity/associated sxs/prior Treatment) HPI Cindy Hernandez is a 39 y.o. female with a history of DUB, comes in for evaluation of back pain, headache and chills. Patient states on Saturday she began to have back pain diffusely throughout her whole back with the upper back aching in the lower back feeling like pins and needles. She rates this discomfort as a 10/10. She took ibuprofen 600 mg which offered some relief. She also reports a gradual onset headache on Monday that started in her left neck and migrated to her frontal head. She reports the same ibuprofen helped relieve his headache, but has now returned. She also reports intermittent chills, turned up the heat in her home to 85 and now reports she feels warm. She also reports a pressure sensation when she urinates but no dysuria, hematuria, vaginal bleeding or discharge. She denies any numbness or weakness, sick to be, vision changes, chest pain, shortness of breath.  Past Medical History  Diagnosis Date  . Dysfunctional uterine bleeding   . Endometritis    Past Surgical History  Procedure Laterality Date  . Wisdom tooth extraction    . Dilation and curettage of uterus  2007    "to clean out infection"   Family History  Problem Relation Age of Onset  . Cancer Sister     cervical   History  Substance Use Topics  . Smoking status: Current Every Day Smoker -- 1.00 packs/day for 26 years    Types: Cigarettes  . Smokeless tobacco: Never Used  . 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 Authorizing Provider  clindamycin (CLEOCIN) 300 MG capsule Take 1 capsule (300 mg total) by mouth 2 (two) times daily. 04/29/14  Yes Tanna Savoy Stinson, DO  amoxicillin-clavulanate (AUGMENTIN) 875-125 MG per tablet Take 1 tablet by mouth 2 (two) times daily. Patient not taking: Reported on 09/11/2014 02/02/14   Seabron Spates, CNM  cephALEXin (KEFLEX) 500 MG capsule Take 1 capsule (500 mg total) by mouth 4 (four) times daily. 09/11/14   Comer Locket, PA-C  ibuprofen (ADVIL,MOTRIN) 600 MG tablet Take 1 tablet (600 mg total) by mouth every 6 (six) hours as needed. 09/11/14   Comer Locket, PA-C   BP 127/72 mmHg  Pulse 98  Temp(Src) 99.1 F (37.3 C) (Oral)  Resp 20  SpO2 100% Physical Exam  Constitutional: She is oriented to person, place, and time. She appears well-developed and well-nourished.  HENT:  Head: Normocephalic and atraumatic.  Mouth/Throat: Oropharynx is clear and moist.  Eyes: Conjunctivae are normal. Pupils are equal, round, and reactive to light. Right eye exhibits no discharge. Left eye exhibits no discharge. No scleral icterus.  Neck:  Normal range of motion. Neck supple.  No meningismus or nuchal rigidity  Cardiovascular: Normal rate, regular rhythm and normal heart sounds.   Pulmonary/Chest: Effort normal and breath sounds normal. No respiratory distress. She has no wheezes. She has no rales.  Abdominal: Soft. There is no tenderness.  Musculoskeletal: She exhibits no tenderness.  Diffuse tenderness to palpation throughout the entirety of back without any midline bony tenderness. No crepitus, deformities or step-offs noted. No lesions.  Neurological: She is alert and oriented to person, place, and time.  Cranial Nerves  II-XII grossly intact. Motor and sensation 5/5 in all 4 strategies. Completes finger to nose Carnation movements without difficulty. Extraocular movements intact without nystagmus.  Skin: Skin is warm and dry. No rash noted.  Psychiatric: She has a normal mood and affect.  Nursing note and vitals reviewed.   ED Course  Procedures (including critical care time) Labs Review Labs Reviewed  URINALYSIS, ROUTINE W REFLEX MICROSCOPIC - Abnormal; Notable for the following:    APPearance CLOUDY (*)    Hgb urine dipstick SMALL (*)    Leukocytes, UA MODERATE (*)    All other components within normal limits  URINE MICROSCOPIC-ADD ON  POC URINE PREG, ED    Imaging Review No results found.   EKG Interpretation None     Meds given in ED:  Medications  sodium chloride 0.9 % bolus 1,000 mL (not administered)  cefTRIAXone (ROCEPHIN) 1 g in dextrose 5 % 50 mL IVPB (not administered)  ketorolac (TORADOL) 30 MG/ML injection 30 mg (30 mg Intravenous Given 09/11/14 0944)  diazepam (VALIUM) tablet 5 mg (5 mg Oral Given 09/11/14 0944)  magnesium sulfate IVPB 2 g 50 mL (2 g Intravenous New Bag/Given 09/11/14 0944)  acetaminophen (TYLENOL) tablet 1,000 mg (1,000 mg Oral Given 09/11/14 1000)    New Prescriptions   CEPHALEXIN (KEFLEX) 500 MG CAPSULE    Take 1 capsule (500 mg total) by mouth 4 (four) times daily.   IBUPROFEN (ADVIL,MOTRIN) 600 MG TABLET    Take 1 tablet (600 mg total) by mouth every 6 (six) hours as needed.   Filed Vitals:   09/11/14 0841 09/11/14 0853  BP: 102/81 127/72  Pulse: 103 98  Temp: 98.9 F (37.2 C) 99.1 F (37.3 C)  TempSrc:  Oral  Resp: 20   SpO2: 98% 100%    MDM  Vitals stable - WNL and improving in ED -afebrile Pt resting comfortably in ED. Patient reports back discomfort has improved with medications administered in ED. Headache has resolved. PE--thoracic back tenderness. Normal abdominal exam. Normal neuro exam. No meningismus or nuchal rigidity. Physical exam  otherwise grossly benign Labwork--evidence of UTI on urinalysis. Pregnancy negative.   DDX--patient with UTI, diffuse back pain, mildly tachycardic in ED. Will treat empirically for pyelonephritis. Patient received 1 g of ceftriaxone in the ED. Will DC with bactericidal antibiotics and instructions to follow-up PCP Will DC with Keflex, anti-inflammatories and instructions to follow-up with primary care for further evaluation and management of symptoms. I discussed all relevant lab findings and imaging results with pt and they verbalized understanding. Discussed f/u with PCP within 48 hrs and return precautions, pt very amenable to plan.  Prior to patient discharge, I discussed and reviewed this case with Dr. Jeneen Rinks   Final diagnoses:  Pyelonephritis       Comer Locket, PA-C 09/12/14 7846  Tanna Furry, MD 09/13/14 [phone]

## 2014-09-11 NOTE — ED Notes (Signed)
Pt crying , states severe back pain==has taken Ibuprofen 600mg  at home at 10pm, pt denies vag discharge, states has pressure with urination, has had subjective fever last night, is chilled this morning, headache frontal and occipital area, stiff neck 2 days ago.

## 2014-10-02 ENCOUNTER — Other Ambulatory Visit (HOSPITAL_COMMUNITY)
Admission: RE | Admit: 2014-10-02 | Discharge: 2014-10-02 | Disposition: A | Discharge status: Home / Self Care | Payer: Medicaid Other | Source: Ambulatory Visit | Attending: Orthopedic Surgery | Admitting: Orthopedic Surgery

## 2014-10-02 ENCOUNTER — Other Ambulatory Visit (HOSPITAL_COMMUNITY)
Admission: RE | Admit: 2014-10-02 | Discharge: 2014-10-02 | Disposition: A | Discharge status: Home / Self Care | Payer: Medicaid Other | Source: Ambulatory Visit | Attending: Obstetrics & Gynecology | Admitting: Obstetrics & Gynecology

## 2014-10-02 ENCOUNTER — Encounter: Payer: Self-pay | Admitting: Obstetrics & Gynecology

## 2014-10-02 ENCOUNTER — Ambulatory Visit (INDEPENDENT_AMBULATORY_CARE_PROVIDER_SITE_OTHER): Payer: Self-pay | Admitting: Obstetrics & Gynecology

## 2014-10-02 ENCOUNTER — Other Ambulatory Visit (HOSPITAL_COMMUNITY)
Admission: RE | Admit: 2014-10-02 | Payer: Medicaid Other | Source: Ambulatory Visit | Admitting: Obstetrics & Gynecology

## 2014-10-02 VITALS — BP 107/51 | HR 59 | Temp 98.2°F | Resp 20 | Ht 63.5 in | Wt 129.1 lb

## 2014-10-02 DIAGNOSIS — Z01419 Encounter for gynecological examination (general) (routine) without abnormal findings: Secondary | ICD-10-CM

## 2014-10-02 DIAGNOSIS — Z124 Encounter for screening for malignant neoplasm of cervix: Secondary | ICD-10-CM | POA: Insufficient documentation

## 2014-10-02 DIAGNOSIS — Z1151 Encounter for screening for human papillomavirus (HPV): Secondary | ICD-10-CM | POA: Diagnosis present

## 2014-10-02 DIAGNOSIS — Z3042 Encounter for surveillance of injectable contraceptive: Secondary | ICD-10-CM

## 2014-10-02 MED ORDER — MEDROXYPROGESTERONE ACETATE 150 MG/ML IM SUSP
150.0000 mg | Freq: Once | INTRAMUSCULAR | Status: AC
  Administered 2014-10-02: 150 mg via INTRAMUSCULAR

## 2014-10-02 NOTE — Progress Notes (Signed)
Pt states she was treated for kidney infection last month. She only took medicine for approximately 7 days, then stopped because it was making her nauseous. She has now resumed the antibiotic. She reports having a reddish/orange vaginal discharge now. Last pap done 07/2012. She reports history of all normal Paps.

## 2014-10-02 NOTE — Patient Instructions (Signed)
Preventive Care for Adults A healthy lifestyle and preventive care can promote health and wellness. Preventive health guidelines for women include the following key practices.  A routine yearly physical is a good way to check with your health care provider about your health and preventive screening. It is a chance to share any concerns and updates on your health and to receive a thorough exam.  Visit your dentist for a routine exam and preventive care every 6 months. Brush your teeth twice a day and floss once a day. Good oral hygiene prevents tooth decay and gum disease.  The frequency of eye exams is based on your age, health, family medical history, use of contact lenses, and other factors. Follow your health care provider's recommendations for frequency of eye exams.  Eat a healthy diet. Foods like vegetables, fruits, whole grains, low-fat dairy products, and lean protein foods contain the nutrients you need without too many calories. Decrease your intake of foods high in solid fats, added sugars, and salt. Eat the right amount of calories for you.Get information about a proper diet from your health care provider, if necessary.  Regular physical exercise is one of the most important things you can do for your health. Most adults should get at least 150 minutes of moderate-intensity exercise (any activity that increases your heart rate and causes you to sweat) each week. In addition, most adults need muscle-strengthening exercises on 2 or more days a week.  Maintain a healthy weight. The body mass index (BMI) is a screening tool to identify possible weight problems. It provides an estimate of body fat based on height and weight. Your health care provider can find your BMI and can help you achieve or maintain a healthy weight.For adults 20 years and older:  A BMI below 18.5 is considered underweight.  A BMI of 18.5 to 24.9 is normal.  A BMI of 25 to 29.9 is considered overweight.  A BMI of  30 and above is considered obese.  Maintain normal blood lipids and cholesterol levels by exercising and minimizing your intake of saturated fat. Eat a balanced diet with plenty of fruit and vegetables. Blood tests for lipids and cholesterol should begin at age 76 and be repeated every 5 years. If your lipid or cholesterol levels are high, you are over 50, or you are at high risk for heart disease, you may need your cholesterol levels checked more frequently.Ongoing high lipid and cholesterol levels should be treated with medicines if diet and exercise are not working.  If you smoke, find out from your health care provider how to quit. If you do not use tobacco, do not start.  Lung cancer screening is recommended for adults aged 22-80 years who are at high risk for developing lung cancer because of a history of smoking. A yearly low-dose CT scan of the lungs is recommended for people who have at least a 30-pack-year history of smoking and are a current smoker or have quit within the past 15 years. A pack year of smoking is smoking an average of 1 pack of cigarettes a day for 1 year (for example: 1 pack a day for 30 years or 2 packs a day for 15 years). Yearly screening should continue until the smoker has stopped smoking for at least 15 years. Yearly screening should be stopped for people who develop a health problem that would prevent them from having lung cancer treatment.  If you are pregnant, do not drink alcohol. If you are breastfeeding,  be very cautious about drinking alcohol. If you are not pregnant and choose to drink alcohol, do not have more than 1 drink per day. One drink is considered to be 12 ounces (355 mL) of beer, 5 ounces (148 mL) of wine, or 1.5 ounces (44 mL) of liquor.  Avoid use of street drugs. Do not share needles with anyone. Ask for help if you need support or instructions about stopping the use of drugs.  High blood pressure causes heart disease and increases the risk of  stroke. Your blood pressure should be checked at least every 1 to 2 years. Ongoing high blood pressure should be treated with medicines if weight loss and exercise do not work.  If you are 75-52 years old, ask your health care provider if you should take aspirin to prevent strokes.  Diabetes screening involves taking a blood sample to check your fasting blood sugar level. This should be done once every 3 years, after age 15, if you are within normal weight and without risk factors for diabetes. Testing should be considered at a younger age or be carried out more frequently if you are overweight and have at least 1 risk factor for diabetes.  Breast cancer screening is essential preventive care for women. You should practice "breast self-awareness." This means understanding the normal appearance and feel of your breasts and may include breast self-examination. Any changes detected, no matter how small, should be reported to a health care provider. Women in their 58s and 30s should have a clinical breast exam (CBE) by a health care provider as part of a regular health exam every 1 to 3 years. After age 16, women should have a CBE every year. Starting at age 53, women should consider having a mammogram (breast X-ray test) every year. Women who have a family history of breast cancer should talk to their health care provider about genetic screening. Women at a high risk of breast cancer should talk to their health care providers about having an MRI and a mammogram every year.  Breast cancer gene (BRCA)-related cancer risk assessment is recommended for women who have family members with BRCA-related cancers. BRCA-related cancers include breast, ovarian, tubal, and peritoneal cancers. Having family members with these cancers may be associated with an increased risk for harmful changes (mutations) in the breast cancer genes BRCA1 and BRCA2. Results of the assessment will determine the need for genetic counseling and  BRCA1 and BRCA2 testing.  Routine pelvic exams to screen for cancer are no longer recommended for nonpregnant women who are considered low risk for cancer of the pelvic organs (ovaries, uterus, and vagina) and who do not have symptoms. Ask your health care provider if a screening pelvic exam is right for you.  If you have had past treatment for cervical cancer or a condition that could lead to cancer, you need Pap tests and screening for cancer for at least 20 years after your treatment. If Pap tests have been discontinued, your risk factors (such as having a new sexual partner) need to be reassessed to determine if screening should be resumed. Some women have medical problems that increase the chance of getting cervical cancer. In these cases, your health care provider may recommend more frequent screening and Pap tests.  The HPV test is an additional test that may be used for cervical cancer screening. The HPV test looks for the virus that can cause the cell changes on the cervix. The cells collected during the Pap test can be  tested for HPV. The HPV test could be used to screen women aged 30 years and older, and should be used in women of any age who have unclear Pap test results. After the age of 30, women should have HPV testing at the same frequency as a Pap test.  Colorectal cancer can be detected and often prevented. Most routine colorectal cancer screening begins at the age of 50 years and continues through age 75 years. However, your health care provider may recommend screening at an earlier age if you have risk factors for colon cancer. On a yearly basis, your health care provider may provide home test kits to check for hidden blood in the stool. Use of a small camera at the end of a tube, to directly examine the colon (sigmoidoscopy or colonoscopy), can detect the earliest forms of colorectal cancer. Talk to your health care provider about this at age 50, when routine screening begins. Direct  exam of the colon should be repeated every 5-10 years through age 75 years, unless early forms of pre-cancerous polyps or small growths are found.  People who are at an increased risk for hepatitis B should be screened for this virus. You are considered at high risk for hepatitis B if:  You were born in a country where hepatitis B occurs often. Talk with your health care provider about which countries are considered high risk.  Your parents were born in a high-risk country and you have not received a shot to protect against hepatitis B (hepatitis B vaccine).  You have HIV or AIDS.  You use needles to inject street drugs.  You live with, or have sex with, someone who has hepatitis B.  You get hemodialysis treatment.  You take certain medicines for conditions like cancer, organ transplantation, and autoimmune conditions.  Hepatitis C blood testing is recommended for all people born from 1945 through 1965 and any individual with known risks for hepatitis C.  Practice safe sex. Use condoms and avoid high-risk sexual practices to reduce the spread of sexually transmitted infections (STIs). STIs include gonorrhea, chlamydia, syphilis, trichomonas, herpes, HPV, and human immunodeficiency virus (HIV). Herpes, HIV, and HPV are viral illnesses that have no cure. They can result in disability, cancer, and death.  You should be screened for sexually transmitted illnesses (STIs) including gonorrhea and chlamydia if:  You are sexually active and are younger than 24 years.  You are older than 24 years and your health care provider tells you that you are at risk for this type of infection.  Your sexual activity has changed since you were last screened and you are at an increased risk for chlamydia or gonorrhea. Ask your health care provider if you are at risk.  If you are at risk of being infected with HIV, it is recommended that you take a prescription medicine daily to prevent HIV infection. This is  called preexposure prophylaxis (PrEP). You are considered at risk if:  You are a heterosexual woman, are sexually active, and are at increased risk for HIV infection.  You take drugs by injection.  You are sexually active with a partner who has HIV.  Talk with your health care provider about whether you are at high risk of being infected with HIV. If you choose to begin PrEP, you should first be tested for HIV. You should then be tested every 3 months for as long as you are taking PrEP.  Osteoporosis is a disease in which the bones lose minerals and strength   with aging. This can result in serious bone fractures or breaks. The risk of osteoporosis can be identified using a bone density scan. Women ages 65 years and over and women at risk for fractures or osteoporosis should discuss screening with their health care providers. Ask your health care provider whether you should take a calcium supplement or vitamin D to reduce the rate of osteoporosis.  Menopause can be associated with physical symptoms and risks. Hormone replacement therapy is available to decrease symptoms and risks. You should talk to your health care provider about whether hormone replacement therapy is right for you.  Use sunscreen. Apply sunscreen liberally and repeatedly throughout the day. You should seek shade when your shadow is shorter than you. Protect yourself by wearing long sleeves, pants, a wide-brimmed hat, and sunglasses year round, whenever you are outdoors.  Once a month, do a whole body skin exam, using a mirror to look at the skin on your back. Tell your health care provider of new moles, moles that have irregular borders, moles that are larger than a pencil eraser, or moles that have changed in shape or color.  Stay current with required vaccines (immunizations).  Influenza vaccine. All adults should be immunized every year.  Tetanus, diphtheria, and acellular pertussis (Td, Tdap) vaccine. Pregnant women should  receive 1 dose of Tdap vaccine during each pregnancy. The dose should be obtained regardless of the length of time since the last dose. Immunization is preferred during the 27th-36th week of gestation. An adult who has not previously received Tdap or who does not know her vaccine status should receive 1 dose of Tdap. This initial dose should be followed by tetanus and diphtheria toxoids (Td) booster doses every 10 years. Adults with an unknown or incomplete history of completing a 3-dose immunization series with Td-containing vaccines should begin or complete a primary immunization series including a Tdap dose. Adults should receive a Td booster every 10 years.  Varicella vaccine. An adult without evidence of immunity to varicella should receive 2 doses or a second dose if she has previously received 1 dose. Pregnant females who do not have evidence of immunity should receive the first dose after pregnancy. This first dose should be obtained before leaving the health care facility. The second dose should be obtained 4-8 weeks after the first dose.  Human papillomavirus (HPV) vaccine. Females aged 13-26 years who have not received the vaccine previously should obtain the 3-dose series. The vaccine is not recommended for use in pregnant females. However, pregnancy testing is not needed before receiving a dose. If a female is found to be pregnant after receiving a dose, no treatment is needed. In that case, the remaining doses should be delayed until after the pregnancy. Immunization is recommended for any person with an immunocompromised condition through the age of 26 years if she did not get any or all doses earlier. During the 3-dose series, the second dose should be obtained 4-8 weeks after the first dose. The third dose should be obtained 24 weeks after the first dose and 16 weeks after the second dose.  Zoster vaccine. One dose is recommended for adults aged 60 years or older unless certain conditions are  present.  Measles, mumps, and rubella (MMR) vaccine. Adults born before 1957 generally are considered immune to measles and mumps. Adults born in 1957 or later should have 1 or more doses of MMR vaccine unless there is a contraindication to the vaccine or there is laboratory evidence of immunity to   each of the three diseases. A routine second dose of MMR vaccine should be obtained at least 28 days after the first dose for students attending postsecondary schools, health care workers, or international travelers. People who received inactivated measles vaccine or an unknown type of measles vaccine during 1963-1967 should receive 2 doses of MMR vaccine. People who received inactivated mumps vaccine or an unknown type of mumps vaccine before 1979 and are at high risk for mumps infection should consider immunization with 2 doses of MMR vaccine. For females of childbearing age, rubella immunity should be determined. If there is no evidence of immunity, females who are not pregnant should be vaccinated. If there is no evidence of immunity, females who are pregnant should delay immunization until after pregnancy. Unvaccinated health care workers born before 1957 who lack laboratory evidence of measles, mumps, or rubella immunity or laboratory confirmation of disease should consider measles and mumps immunization with 2 doses of MMR vaccine or rubella immunization with 1 dose of MMR vaccine.  Pneumococcal 13-valent conjugate (PCV13) vaccine. When indicated, a person who is uncertain of her immunization history and has no record of immunization should receive the PCV13 vaccine. An adult aged 19 years or older who has certain medical conditions and has not been previously immunized should receive 1 dose of PCV13 vaccine. This PCV13 should be followed with a dose of pneumococcal polysaccharide (PPSV23) vaccine. The PPSV23 vaccine dose should be obtained at least 8 weeks after the dose of PCV13 vaccine. An adult aged 19  years or older who has certain medical conditions and previously received 1 or more doses of PPSV23 vaccine should receive 1 dose of PCV13. The PCV13 vaccine dose should be obtained 1 or more years after the last PPSV23 vaccine dose.  Pneumococcal polysaccharide (PPSV23) vaccine. When PCV13 is also indicated, PCV13 should be obtained first. All adults aged 65 years and older should be immunized. An adult younger than age 65 years who has certain medical conditions should be immunized. Any person who resides in a nursing home or long-term care facility should be immunized. An adult smoker should be immunized. People with an immunocompromised condition and certain other conditions should receive both PCV13 and PPSV23 vaccines. People with human immunodeficiency virus (HIV) infection should be immunized as soon as possible after diagnosis. Immunization during chemotherapy or radiation therapy should be avoided. Routine use of PPSV23 vaccine is not recommended for American Indians, [place] Natives, or people younger than 65 years unless there are medical conditions that require PPSV23 vaccine. When indicated, people who have unknown immunization and have no record of immunization should receive PPSV23 vaccine. One-time revaccination 5 years after the first dose of PPSV23 is recommended for people aged 19-64 years who have chronic kidney failure, nephrotic syndrome, asplenia, or immunocompromised conditions. People who received 1-2 doses of PPSV23 before age 65 years should receive another dose of PPSV23 vaccine at age 65 years or later if at least 5 years have passed since the previous dose. Doses of PPSV23 are not needed for people immunized with PPSV23 at or after age 65 years.  Meningococcal vaccine. Adults with asplenia or persistent complement component deficiencies should receive 2 doses of quadrivalent meningococcal conjugate (MenACWY-D) vaccine. The doses should be obtained at least 2 months apart.  Microbiologists working with certain meningococcal bacteria, military recruits, people at risk during an outbreak, and people who travel to or live in countries with a high rate of meningitis should be immunized. A first-year college student up through age   21 years who is living in a residence hall should receive a dose if she did not receive a dose on or after her 16th birthday. Adults who have certain high-risk conditions should receive one or more doses of vaccine.  Hepatitis A vaccine. Adults who wish to be protected from this disease, have certain high-risk conditions, work with hepatitis A-infected animals, work in hepatitis A research labs, or travel to or work in countries with a high rate of hepatitis A should be immunized. Adults who were previously unvaccinated and who anticipate close contact with an international adoptee during the first 60 days after arrival in the [place] from a country with a high rate of hepatitis A should be immunized.  Hepatitis B vaccine. Adults who wish to be protected from this disease, have certain high-risk conditions, may be exposed to blood or other infectious body fluids, are household contacts or sex partners of hepatitis B positive people, are clients or workers in certain care facilities, or travel to or work in countries with a high rate of hepatitis B should be immunized.  Haemophilus influenzae type b (Hib) vaccine. A previously unvaccinated person with asplenia or sickle cell disease or having a scheduled splenectomy should receive 1 dose of Hib vaccine. Regardless of previous immunization, a recipient of a hematopoietic stem cell transplant should receive a 3-dose series 6-12 months after her successful transplant. Hib vaccine is not recommended for adults with HIV infection. Preventive Services / Frequency Ages 64 to 68 years  Blood pressure check.** / Every 1 to 2 years.  Lipid and cholesterol check.** / Every 5 years beginning at age  22.  Clinical breast exam.** / Every 3 years for women in their 88s and 53s.  BRCA-related cancer risk assessment.** / For women who have family members with a BRCA-related cancer (breast, ovarian, tubal, or peritoneal cancers).  Pap test.** / Every 2 years from ages 90 through 51. Every 3 years starting at age 21 through age 56 or 3 with a history of 3 consecutive normal Pap tests.  HPV screening.** / Every 3 years from ages 24 through ages 1 to 46 with a history of 3 consecutive normal Pap tests.  Hepatitis C blood test.** / For any individual with known risks for hepatitis C.  Skin self-exam. / Monthly.  Influenza vaccine. / Every year.  Tetanus, diphtheria, and acellular pertussis (Tdap, Td) vaccine.** / Consult your health care provider. Pregnant women should receive 1 dose of Tdap vaccine during each pregnancy. 1 dose of Td every 10 years.  Varicella vaccine.** / Consult your health care provider. Pregnant females who do not have evidence of immunity should receive the first dose after pregnancy.  HPV vaccine. / 3 doses over 6 months, if 72 and younger. The vaccine is not recommended for use in pregnant females. However, pregnancy testing is not needed before receiving a dose.  Measles, mumps, rubella (MMR) vaccine.** / You need at least 1 dose of MMR if you were born in 1957 or later. You may also need a 2nd dose. For females of childbearing age, rubella immunity should be determined. If there is no evidence of immunity, females who are not pregnant should be vaccinated. If there is no evidence of immunity, females who are pregnant should delay immunization until after pregnancy.  Pneumococcal 13-valent conjugate (PCV13) vaccine.** / Consult your health care provider.  Pneumococcal polysaccharide (PPSV23) vaccine.** / 1 to 2 doses if you smoke cigarettes or if you have certain conditions.  Meningococcal vaccine.** /  1 dose if you are age 19 to 21 years and a first-year college  student living in a residence hall, or have one of several medical conditions, you need to get vaccinated against meningococcal disease. You may also need additional booster doses.  Hepatitis A vaccine.** / Consult your health care provider.  Hepatitis B vaccine.** / Consult your health care provider.  Haemophilus influenzae type b (Hib) vaccine.** / Consult your health care provider. Ages 40 to 64 years  Blood pressure check.** / Every 1 to 2 years.  Lipid and cholesterol check.** / Every 5 years beginning at age 20 years.  Lung cancer screening. / Every year if you are aged 55-80 years and have a 30-pack-year history of smoking and currently smoke or have quit within the past 15 years. Yearly screening is stopped once you have quit smoking for at least 15 years or develop a health problem that would prevent you from having lung cancer treatment.  Clinical breast exam.** / Every year after age 40 years.  BRCA-related cancer risk assessment.** / For women who have family members with a BRCA-related cancer (breast, ovarian, tubal, or peritoneal cancers).  Mammogram.** / Every year beginning at age 40 years and continuing for as long as you are in good health. Consult with your health care provider.  Pap test.** / Every 3 years starting at age 30 years through age 65 or 70 years with a history of 3 consecutive normal Pap tests.  HPV screening.** / Every 3 years from ages 30 years through ages 65 to 70 years with a history of 3 consecutive normal Pap tests.  Fecal occult blood test (FOBT) of stool. / Every year beginning at age 50 years and continuing until age 75 years. You may not need to do this test if you get a colonoscopy every 10 years.  Flexible sigmoidoscopy or colonoscopy.** / Every 5 years for a flexible sigmoidoscopy or every 10 years for a colonoscopy beginning at age 50 years and continuing until age 75 years.  Hepatitis C blood test.** / For all people born from 1945 through  1965 and any individual with known risks for hepatitis C.  Skin self-exam. / Monthly.  Influenza vaccine. / Every year.  Tetanus, diphtheria, and acellular pertussis (Tdap/Td) vaccine.** / Consult your health care provider. Pregnant women should receive 1 dose of Tdap vaccine during each pregnancy. 1 dose of Td every 10 years.  Varicella vaccine.** / Consult your health care provider. Pregnant females who do not have evidence of immunity should receive the first dose after pregnancy.  Zoster vaccine.** / 1 dose for adults aged 60 years or older.  Measles, mumps, rubella (MMR) vaccine.** / You need at least 1 dose of MMR if you were born in 1957 or later. You may also need a 2nd dose. For females of childbearing age, rubella immunity should be determined. If there is no evidence of immunity, females who are not pregnant should be vaccinated. If there is no evidence of immunity, females who are pregnant should delay immunization until after pregnancy.  Pneumococcal 13-valent conjugate (PCV13) vaccine.** / Consult your health care provider.  Pneumococcal polysaccharide (PPSV23) vaccine.** / 1 to 2 doses if you smoke cigarettes or if you have certain conditions.  Meningococcal vaccine.** / Consult your health care provider.  Hepatitis A vaccine.** / Consult your health care provider.  Hepatitis B vaccine.** / Consult your health care provider.  Haemophilus influenzae type b (Hib) vaccine.** / Consult your health care provider. Ages 65   years and over  Blood pressure check.** / Every 1 to 2 years.  Lipid and cholesterol check.** / Every 5 years beginning at age 22 years.  Lung cancer screening. / Every year if you are aged 73-80 years and have a 30-pack-year history of smoking and currently smoke or have quit within the past 15 years. Yearly screening is stopped once you have quit smoking for at least 15 years or develop a health problem that would prevent you from having lung cancer  treatment.  Clinical breast exam.** / Every year after age 4 years.  BRCA-related cancer risk assessment.** / For women who have family members with a BRCA-related cancer (breast, ovarian, tubal, or peritoneal cancers).  Mammogram.** / Every year beginning at age 40 years and continuing for as long as you are in good health. Consult with your health care provider.  Pap test.** / Every 3 years starting at age 9 years through age 34 or 91 years with 3 consecutive normal Pap tests. Testing can be stopped between 65 and 70 years with 3 consecutive normal Pap tests and no abnormal Pap or HPV tests in the past 10 years.  HPV screening.** / Every 3 years from ages 57 years through ages 64 or 45 years with a history of 3 consecutive normal Pap tests. Testing can be stopped between 65 and 70 years with 3 consecutive normal Pap tests and no abnormal Pap or HPV tests in the past 10 years.  Fecal occult blood test (FOBT) of stool. / Every year beginning at age 15 years and continuing until age 17 years. You may not need to do this test if you get a colonoscopy every 10 years.  Flexible sigmoidoscopy or colonoscopy.** / Every 5 years for a flexible sigmoidoscopy or every 10 years for a colonoscopy beginning at age 86 years and continuing until age 71 years.  Hepatitis C blood test.** / For all people born from [birth year] through 1964 and any individual with known risks for hepatitis C.  Osteoporosis screening.** / A one-time screening for women ages 83 years and over and women at risk for fractures or osteoporosis.  Skin self-exam. / Monthly.  Influenza vaccine. / Every year.  Tetanus, diphtheria, and acellular pertussis (Tdap/Td) vaccine.** / 1 dose of Td every 10 years.  Varicella vaccine.** / Consult your health care provider.  Zoster vaccine.** / 1 dose for adults aged 61 years or older.  Pneumococcal 13-valent conjugate (PCV13) vaccine.** / Consult your health care provider.  Pneumococcal  polysaccharide (PPSV23) vaccine.** / 1 dose for all adults aged 28 years and older.  Meningococcal vaccine.** / Consult your health care provider.  Hepatitis A vaccine.** / Consult your health care provider.  Hepatitis B vaccine.** / Consult your health care provider.  Haemophilus influenzae type b (Hib) vaccine.** / Consult your health care provider. ** Family history and personal history of risk and conditions may change your health care provider's recommendations. Document Released: 07/13/2001 Document Revised: 10/01/2013 Document Reviewed: 10/12/2010 [facility] Patient Information 2015 [place], [place]. This information is not intended to replace advice given to you by your health care provider. Make sure you discuss any questions you have with your health care provider.

## 2014-10-02 NOTE — Progress Notes (Signed)
GYNECOLOGY CLINIC ANNUAL PREVENTATIVE CARE ENCOUNTER NOTE  Subjective:     Cindy Hernandez is a 39 y.o. G29P3003 female here for a routine annual gynecologic exam.  Current complaints: none.     Gynecologic History No LMP recorded. Patient has had an injection. Contraception: Depo-Provera injections Last Pap: 07/2012. Results were: normal  Obstetric History OB History  Gravida Para Term Preterm AB SAB TAB Ectopic Multiple Living  3 3 3  0 0 0 0 0 0 3    # Outcome Date GA Lbr Len/2nd Weight Sex Delivery Anes PTL Lv  3 Term 01/18/14 [redacted]w[redacted]d 11:35 / 00:04 6 lb 5.6 oz (2.88 kg) M Vag-Spont None  Y  2 Term 04/06/96    M Vag-Spont None  Y  1 Term 04/11/93    Jerilynn Mages Vag-Spont None  Y      Past Medical History  Diagnosis Date  . Dysfunctional uterine bleeding   . Endometritis   . Pyelonephritis     Past Surgical History  Procedure Laterality Date  . Wisdom tooth extraction    . Dilation and curettage of uterus  2007    "to clean out infection"    Allergies  Allergen Reactions  . Flagyl [Metronidazole] Nausea And Vomiting    History   Social History  . Marital Status: Single    Spouse Name: N/A  . Number of Children: N/A  . Years of Education: N/A   Occupational History  . Not on file.   Social History Main Topics  . Smoking status: Current Every Day Smoker -- 1.00 packs/day for 26 years    Types: Cigarettes  . Smokeless tobacco: Never Used  . Alcohol Use: No     Comment: cocaine use until pos preg test; Marijuana until 01/16/14  . Drug Use: 4.00 per week    Special: Marijuana, Cocaine     Comment: no cocaine x1 year  . Sexual Activity: Yes    Birth Control/ Protection: None, Injection     Comment: last unprotected intercourse 9/15.  pt desires tubal sterilization.   Other Topics Concern  . Not on file   Social History Narrative    Family History  Problem Relation Age of Onset  . Cancer Sister     cervical    The following portions of the patient's  history were reviewed and updated as appropriate: allergies, current medications, past family history, past medical history, past social history, past surgical history and problem list.  Review of Systems A comprehensive review of systems was negative.   Objective:   BP 107/51 mmHg  Pulse 59  Temp(Src) 98.2 F (36.8 C) (Oral)  Resp 20  Ht 5' 3.5" (1.613 m)  Wt 129 lb 1.6 oz (58.559 kg)  BMI 22.51 kg/m2 CONSTITUTIONAL: Well-developed, well-nourished female in no acute distress.  HENT:  Normocephalic, atraumatic, External right and left ear normal. Oropharynx is clear and moist EYES: Conjunctivae and EOM are normal. Pupils are equal, round, and reactive to light. No scleral icterus.  NECK: Normal range of motion, supple, no masses.  Normal thyroid.  SKIN: Skin is warm and dry. No rash noted. Not diaphoretic. No erythema. No pallor. [place]: Alert and oriented to person, place, and time. Normal reflexes, muscle tone coordination. No cranial nerve deficit noted. PSYCHIATRIC: Normal mood and affect. Normal behavior. Normal judgment and thought content. CARDIOVASCULAR: Normal heart rate noted, regular rhythm RESPIRATORY: Clear to auscultation bilaterally. Effort and breath sounds normal, no problems with respiration noted. BREASTS: Symmetric in size.  No masses, skin changes, nipple drainage, or lymphadenopathy. ABDOMEN: Soft, normal bowel sounds, no distention noted.  No tenderness, rebound or guarding.  PELVIC: Normal appearing external genitalia; normal appearing vaginal mucosa and cervix.  Normal appearing discharge.  Pap smear obtained.  Normal uterine size, no other palpable masses, no uterine or adnexal tenderness. MUSCULOSKELETAL: Normal range of motion. No tenderness.  No cyanosis, clubbing, or edema.  2+ distal pulses.   Assessment:   Annual gynecologic examination with pap smear Depo Provera contraception   Plan:   Will follow up results of pap smear and manage  accordingly. Depo Provera given Routine preventative health maintenance measures emphasized   Verita Schneiders, MD, [place] Attending [place] for [place], [place]

## 2014-10-04 LAB — CYTOLOGY - PAP

## 2014-10-10 ENCOUNTER — Telehealth: Payer: Self-pay | Admitting: *Deleted

## 2014-10-10 ENCOUNTER — Encounter: Payer: Self-pay | Admitting: Obstetrics & Gynecology

## 2014-10-10 NOTE — Telephone Encounter (Signed)
Contacted patient, informed of pap results and recommendation of cotesting in one year.  Pt verbalizes understanding and has no further questions.

## 2014-10-10 NOTE — Telephone Encounter (Signed)
-----   Message from Osborne Oman, MD sent at 10/10/2014  3:55 PM EDT ----- Normal cytology on pap smear on 10/02/14, but positive HRHPV.  After HPV DNA testing, it is negative for HPV 16 and 18/45. Will repeat cotesting in one year. Problem list updated. Please call to inform patient of results.

## 2014-11-07 ENCOUNTER — Encounter (HOSPITAL_COMMUNITY): Payer: Self-pay | Admitting: Obstetrics & Gynecology

## 2014-12-18 ENCOUNTER — Ambulatory Visit: Payer: Self-pay

## 2014-12-30 ENCOUNTER — Encounter: Payer: Self-pay | Admitting: *Deleted

## 2014-12-30 ENCOUNTER — Ambulatory Visit (INDEPENDENT_AMBULATORY_CARE_PROVIDER_SITE_OTHER): Payer: Medicaid Other | Admitting: *Deleted

## 2014-12-30 VITALS — BP 110/60 | HR 70 | Temp 98.7°F | Wt 128.6 lb

## 2014-12-30 DIAGNOSIS — Z3042 Encounter for surveillance of injectable contraceptive: Secondary | ICD-10-CM | POA: Diagnosis not present

## 2014-12-30 MED ORDER — MEDROXYPROGESTERONE ACETATE 150 MG/ML IM SUSP
150.0000 mg | Freq: Once | INTRAMUSCULAR | Status: AC
  Administered 2014-12-30: 150 mg via INTRAMUSCULAR

## 2015-01-13 IMAGING — US US OB COMP LESS 14 WK
1 series · 14 of 28 positions shown · non-contrast
Comparison: None.

CLINICAL DATA: Pelvic pain.  6 week 4 day gestational age by LMP.

EXAM:
OBSTETRIC <14 WK ULTRASOUND
TECHNIQUE: Transabdominal ultrasound was performed for evaluation of the
gestation as well as the maternal uterus and adnexal regions.

[Series 1: us ob transvaginal · 14 of 34 slices shown]
[im 2/34]
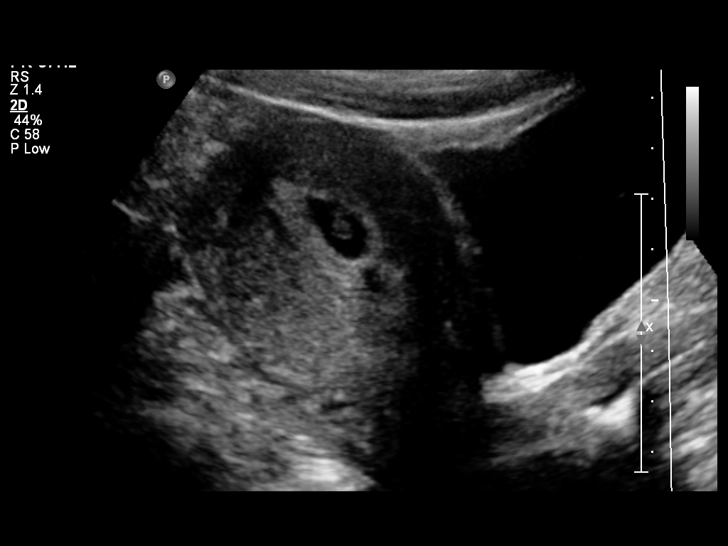
[im 4/34]
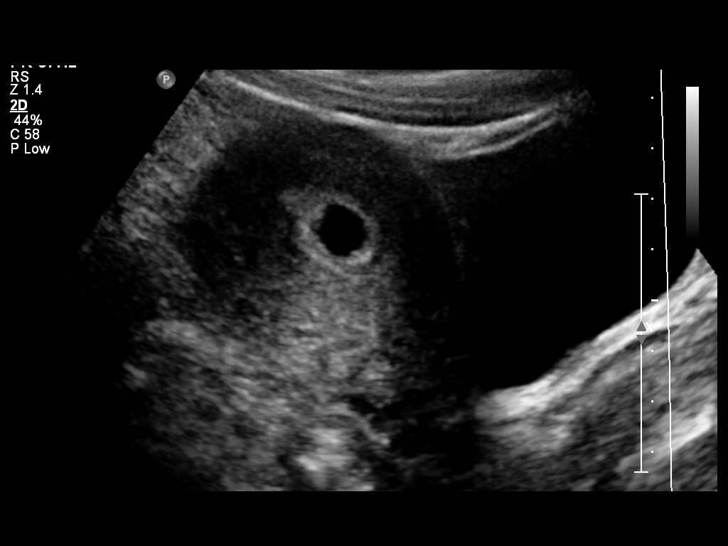
[im 7/34]
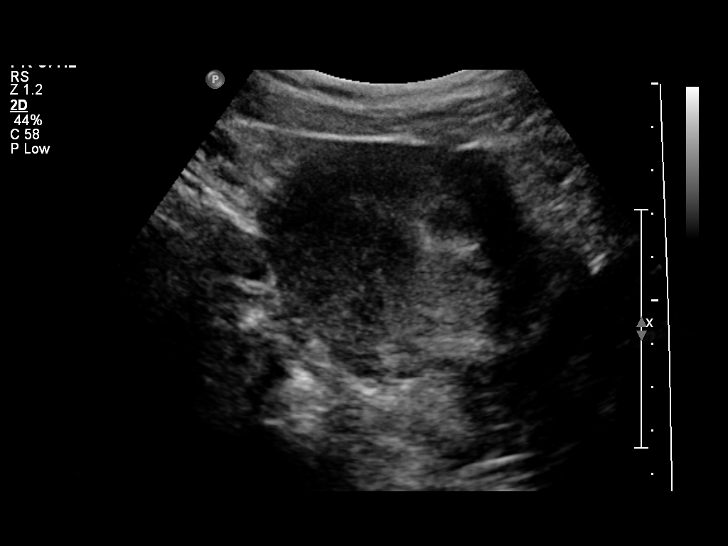
[im 9/34]
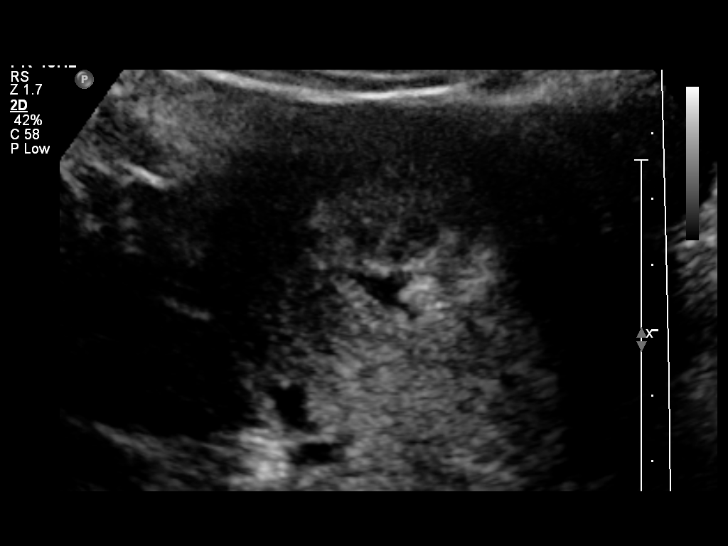
[im 12/34]
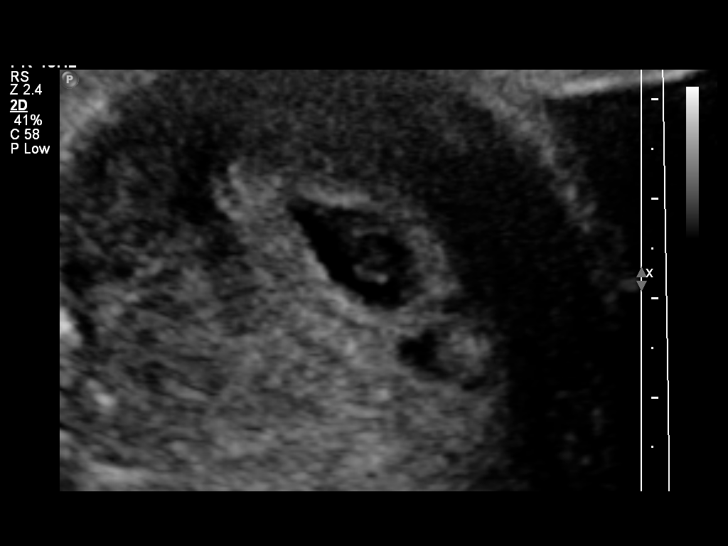
[im 14/34]
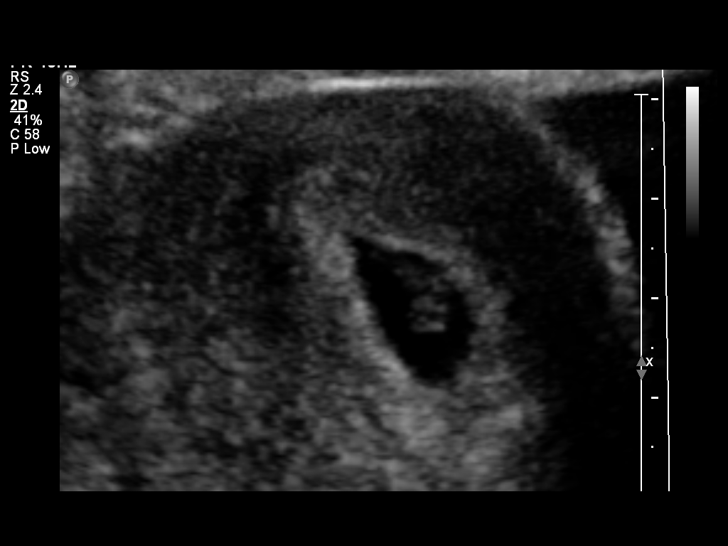
[im 16/34]
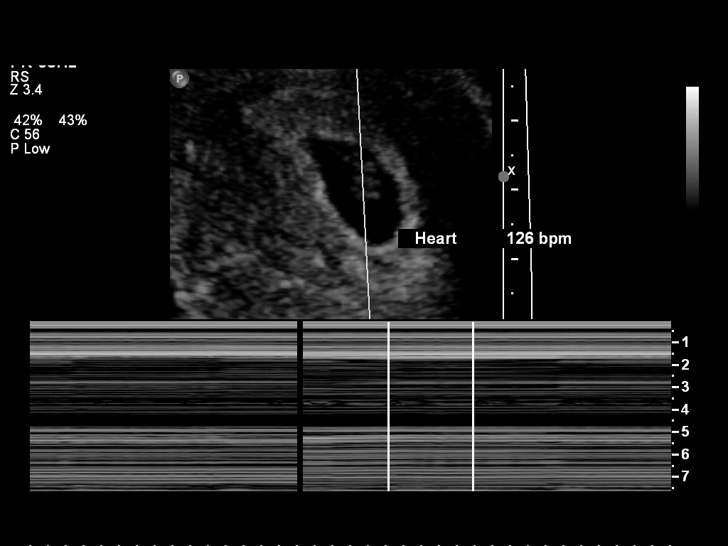
[im 19/34]
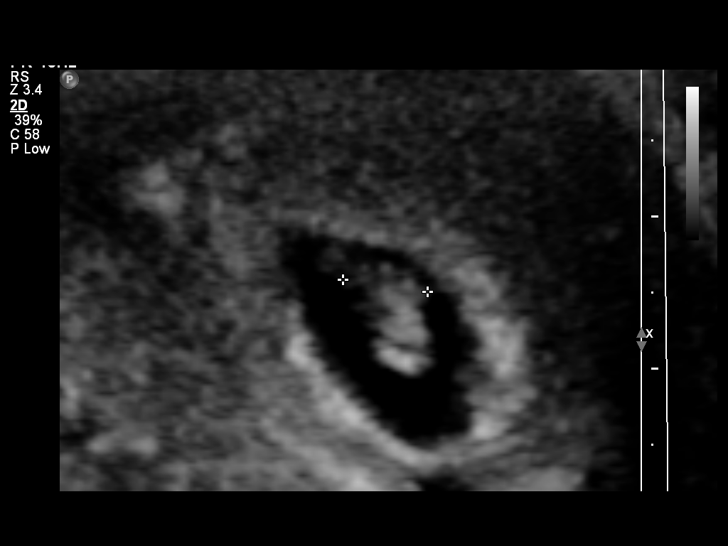
[im 21/34]
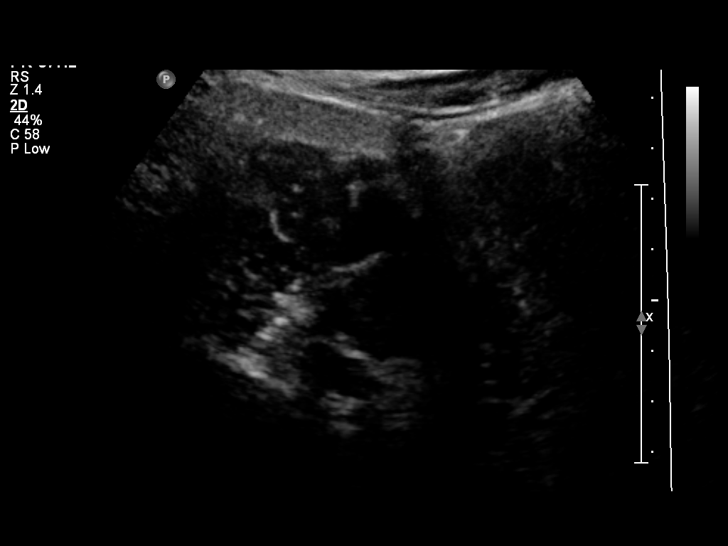
[im 24/34]
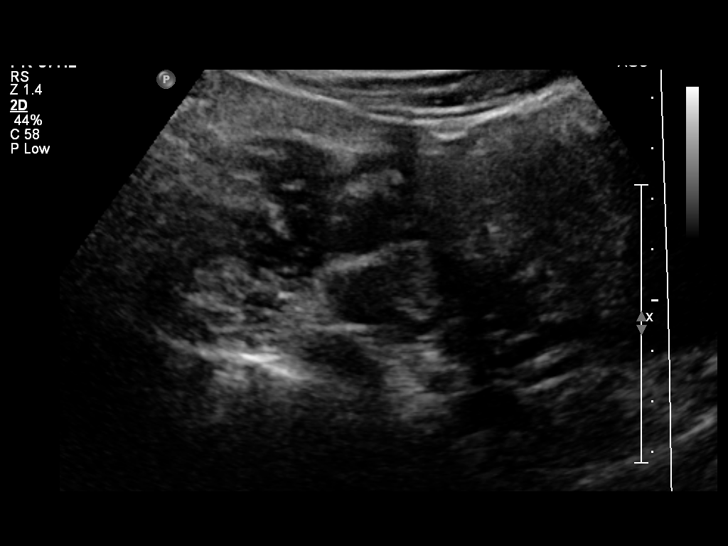
[im 26/34]
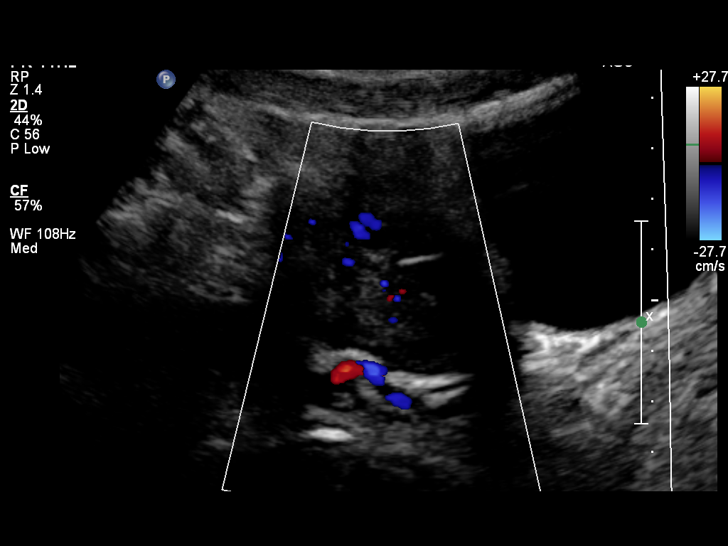
[im 29/34]
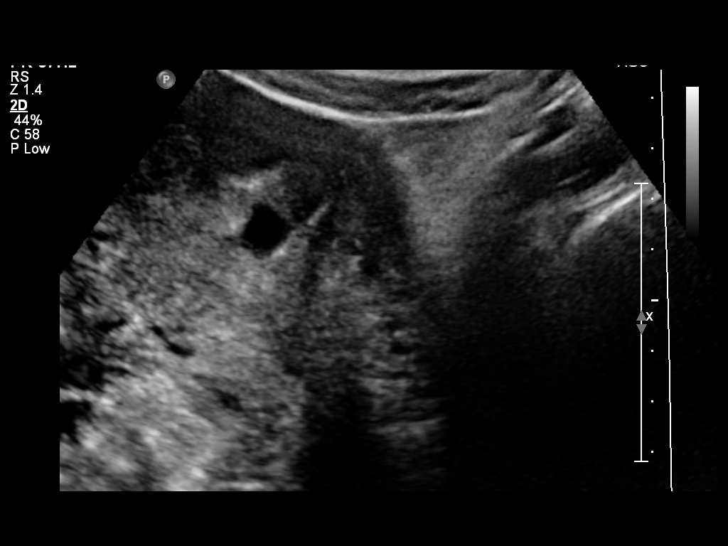
[im 31/34]
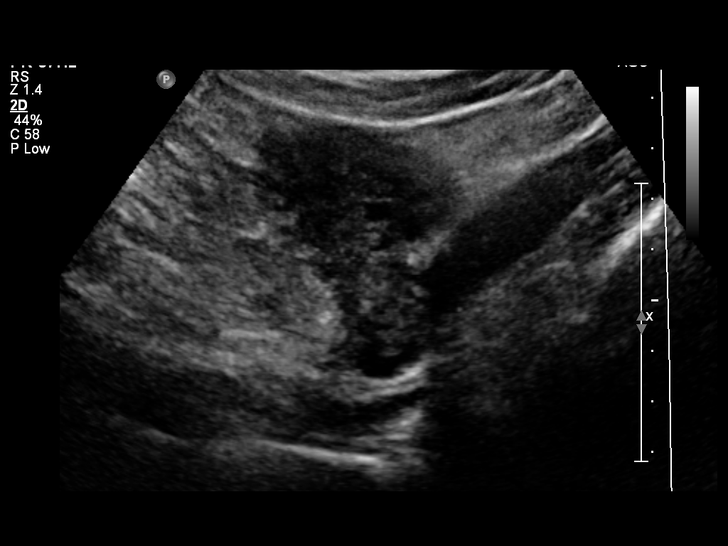
[im 34/34]
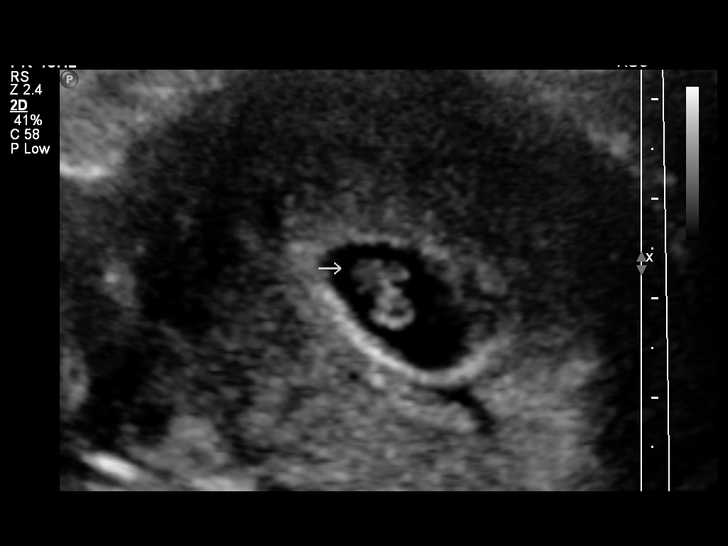

[14 of 28 positions shown; findings below may reference images not displayed]

FINDINGS: Intrauterine gestational sac: Visualized/normal in shape.

Yolk sac:  Visualized

Embryo:  Visualized

Cardiac Activity: Visualized

Heart Rate: 126 bpm

CRL:   6  mm   6 w 3 d                  US EDC: 01/25/2014

Maternal uterus/adnexae: Small subchorionic hemorrhage noted. No
mass or free fluid identified. Both ovaries are normal in
appearance.
IMPRESSION: Single living IUP measuring 6 weeks 3 days, which is concordant with
LMP.

Small subchorionic hemorrhage noted.

## 2015-01-31 ENCOUNTER — Telehealth: Payer: Self-pay | Admitting: *Deleted

## 2015-01-31 NOTE — Telephone Encounter (Signed)
Called patient back and left voicemail that we are returning her call and we are closed until Tuesday. If she is having a problem over the weekend she can go to urgent care. Advised her to call us back Tuesday is she has any questions or concerns for [place].

## 2015-01-31 NOTE — Telephone Encounter (Signed)
Received message left on nurse line on 01/30/15 at 1512.  Patient states she is having the same problem she had before when she was seen for a kidney infection.  Patient requests a return call.

## 2015-02-04 ENCOUNTER — Telehealth: Payer: Self-pay

## 2015-02-04 DIAGNOSIS — N760 Acute vaginitis: Principal | ICD-10-CM

## 2015-02-04 DIAGNOSIS — B9689 Other specified bacterial agents as the cause of diseases classified elsewhere: Secondary | ICD-10-CM

## 2015-02-04 NOTE — Telephone Encounter (Signed)
Pt called and stated that she is still having the same discharge as before.  Can someone please call her?

## 2015-02-05 MED ORDER — CLINDAMYCIN HCL 300 MG PO CAPS: 300.0000 mg | ORAL_CAPSULE | Freq: Two times a day (BID) | ORAL | Status: DC

## 2015-02-05 NOTE — Telephone Encounter (Signed)
Called patient stating I am returning her phone call. Patient states she is having the same discharge as before. Asked patient what discharge is that, is she having itch or odor. Patient states yes she is having discharge with odor. Per chart review, patient has flagyl allergy. Called Dr Ernestina Patches who states patient can have clindamycin 300mg  BID x 7. Med ordered. Informed patient of medication sent to pharmacy. Patient verbalized understanding and had no questions

## 2015-03-17 ENCOUNTER — Ambulatory Visit (INDEPENDENT_AMBULATORY_CARE_PROVIDER_SITE_OTHER): Payer: Medicaid Other | Admitting: *Deleted

## 2015-03-17 DIAGNOSIS — Z3042 Encounter for surveillance of injectable contraceptive: Secondary | ICD-10-CM | POA: Diagnosis present

## 2015-03-17 MED ORDER — MEDROXYPROGESTERONE ACETATE 150 MG/ML IM SUSP
150.0000 mg | Freq: Once | INTRAMUSCULAR | Status: AC
  Administered 2015-03-17: 150 mg via INTRAMUSCULAR

## 2015-06-04 ENCOUNTER — Ambulatory Visit: Payer: Medicaid Other

## 2015-06-09 ENCOUNTER — Ambulatory Visit (INDEPENDENT_AMBULATORY_CARE_PROVIDER_SITE_OTHER): Payer: Medicaid Other

## 2015-06-09 VITALS — BP 103/67 | HR 76 | Wt 126.2 lb

## 2015-06-09 DIAGNOSIS — Z3042 Encounter for surveillance of injectable contraceptive: Secondary | ICD-10-CM

## 2015-06-09 MED ORDER — MEDROXYPROGESTERONE ACETATE 150 MG/ML IM SUSP
150.0000 mg | Freq: Once | INTRAMUSCULAR | Status: AC
  Administered 2015-06-09: 150 mg via INTRAMUSCULAR

## 2015-06-30 ENCOUNTER — Ambulatory Visit: Payer: Medicaid Other | Admitting: Obstetrics and Gynecology

## 2015-08-25 ENCOUNTER — Ambulatory Visit (INDEPENDENT_AMBULATORY_CARE_PROVIDER_SITE_OTHER): Payer: Medicaid Other | Admitting: *Deleted

## 2015-08-25 ENCOUNTER — Encounter: Payer: Self-pay | Admitting: *Deleted

## 2015-08-25 VITALS — BP 111/72 | HR 63 | Temp 97.7°F

## 2015-08-25 DIAGNOSIS — Z3042 Encounter for surveillance of injectable contraceptive: Secondary | ICD-10-CM

## 2015-08-25 MED ORDER — MEDROXYPROGESTERONE ACETATE 150 MG/ML IM SUSP: 150.0000 mg | INTRAMUSCULAR | Status: DC

## 2015-08-25 MED ORDER — MEDROXYPROGESTERONE ACETATE 150 MG/ML IM SUSP
150.0000 mg | Freq: Once | INTRAMUSCULAR | Status: AC
  Administered 2015-08-25: 150 mg via INTRAMUSCULAR

## 2015-11-10 ENCOUNTER — Ambulatory Visit: Payer: Self-pay

## 2015-11-20 ENCOUNTER — Ambulatory Visit (INDEPENDENT_AMBULATORY_CARE_PROVIDER_SITE_OTHER): Payer: Medicaid Other | Admitting: General Practice

## 2015-11-20 VITALS — BP 104/58 | HR 78 | Ht 63.0 in | Wt 128.0 lb

## 2015-11-20 DIAGNOSIS — N938 Other specified abnormal uterine and vaginal bleeding: Secondary | ICD-10-CM | POA: Diagnosis present

## 2015-11-20 MED ORDER — MEDROXYPROGESTERONE ACETATE 150 MG/ML IM SUSP
150.0000 mg | Freq: Once | INTRAMUSCULAR | Status: AC
  Administered 2015-11-20: 150 mg via INTRAMUSCULAR

## 2016-02-05 ENCOUNTER — Ambulatory Visit (INDEPENDENT_AMBULATORY_CARE_PROVIDER_SITE_OTHER): Payer: Medicaid Other | Admitting: General Practice

## 2016-02-05 VITALS — BP 98/67 | HR 72 | Ht 63.0 in | Wt 126.0 lb

## 2016-02-05 DIAGNOSIS — Z3042 Encounter for surveillance of injectable contraceptive: Secondary | ICD-10-CM

## 2016-02-05 DIAGNOSIS — N938 Other specified abnormal uterine and vaginal bleeding: Secondary | ICD-10-CM

## 2016-02-05 MED ORDER — MEDROXYPROGESTERONE ACETATE 150 MG/ML IM SUSP
150.0000 mg | Freq: Once | INTRAMUSCULAR | Status: AC
  Administered 2016-02-05: 150 mg via INTRAMUSCULAR

## 2016-03-15 ENCOUNTER — Encounter: Payer: Self-pay | Admitting: Obstetrics and Gynecology

## 2016-03-15 ENCOUNTER — Ambulatory Visit (INDEPENDENT_AMBULATORY_CARE_PROVIDER_SITE_OTHER): Payer: Medicaid Other | Admitting: Obstetrics and Gynecology

## 2016-03-15 ENCOUNTER — Other Ambulatory Visit (HOSPITAL_COMMUNITY)
Admission: RE | Admit: 2016-03-15 | Discharge: 2016-03-15 | Disposition: A | Discharge status: Home / Self Care | Payer: Medicaid Other | Source: Ambulatory Visit | Attending: Obstetrics and Gynecology | Admitting: Obstetrics and Gynecology

## 2016-03-15 VITALS — BP 103/77 | HR 76 | Ht 63.5 in | Wt 125.4 lb

## 2016-03-15 DIAGNOSIS — F17210 Nicotine dependence, cigarettes, uncomplicated: Secondary | ICD-10-CM

## 2016-03-15 DIAGNOSIS — N879 Dysplasia of cervix uteri, unspecified: Secondary | ICD-10-CM

## 2016-03-15 DIAGNOSIS — Z01411 Encounter for gynecological examination (general) (routine) with abnormal findings: Secondary | ICD-10-CM | POA: Diagnosis present

## 2016-03-15 DIAGNOSIS — Z0000 Encounter for general adult medical examination without abnormal findings: Secondary | ICD-10-CM

## 2016-03-15 DIAGNOSIS — B9689 Other specified bacterial agents as the cause of diseases classified elsewhere: Secondary | ICD-10-CM | POA: Diagnosis not present

## 2016-03-15 DIAGNOSIS — Z1151 Encounter for screening for human papillomavirus (HPV): Secondary | ICD-10-CM | POA: Diagnosis not present

## 2016-03-15 DIAGNOSIS — Z113 Encounter for screening for infections with a predominantly sexual mode of transmission: Secondary | ICD-10-CM | POA: Diagnosis present

## 2016-03-15 DIAGNOSIS — Z01419 Encounter for gynecological examination (general) (routine) without abnormal findings: Secondary | ICD-10-CM

## 2016-03-15 DIAGNOSIS — N760 Acute vaginitis: Secondary | ICD-10-CM

## 2016-03-15 DIAGNOSIS — R300 Dysuria: Secondary | ICD-10-CM

## 2016-03-15 LAB — POCT URINALYSIS DIP (DEVICE)
Bilirubin Urine: NEGATIVE
GLUCOSE, UA: NEGATIVE mg/dL
Hgb urine dipstick: NEGATIVE
KETONES UR: NEGATIVE mg/dL
Nitrite: NEGATIVE
Protein, ur: NEGATIVE mg/dL
SPECIFIC GRAVITY, URINE: 1.02 (ref 1.005–1.030)
Urobilinogen, UA: 1 mg/dL (ref 0.0–1.0)
pH: 7 (ref 5.0–8.0)

## 2016-03-15 MED ORDER — METRONIDAZOLE 0.75 % EX GEL: 1.0000 "application " | Freq: Two times a day (BID) | CUTANEOUS | 1 refills | Status: AC

## 2016-03-15 NOTE — Patient Instructions (Signed)

## 2016-03-15 NOTE — Progress Notes (Signed)
Obstetrics and Gynecology Visit Annual Patient Exam  Appointment Date: 03/15/2016  OBGYN Clinic: Center for [facility] [place]  Chief Complaint:  Chief Complaint  Patient presents with  . Gynecologic Exam    History of Present Illness: Cindy Hernandez is a 40 y.o. African-American G3P3003 (No LMP recorded. Patient has had an injection.), seen for the above chief complaint. Her past medical history is significant for tobacco abuse, slightly abnormal pap last year   No breast s/s, fevers, chills, chest pain, SOB, nausea, vomiting, abdominal pain, hematuria, vaginal itching, dyspareunia, diarrhea, constipation, blood in BMs  Vaginal discharge x 2 months (clear, no itching) and sometimes causes some dryness that makes sex uncomfortable and some spotting after sex. Patient doesn't use lubricants but does use condoms.  Sometimes when she has a coughing fit, she has some urine leakage but nothing recurrent and doesn't have to wear a pad or liner or impact ADLs.   Review of Systems: Her 12 point review of systems is negative or as noted in the History of Present Illness.  Past Medical History:  Past Medical History:  Diagnosis Date  . Dysfunctional uterine bleeding   . Endometritis   . Pyelonephritis     Past Surgical History:  Past Surgical History:  Procedure Laterality Date  . DILATION AND CURETTAGE OF UTERUS  2007   "to clean out infection"  . WISDOM TOOTH EXTRACTION      Past Obstetrical History:  OB History  Gravida Para Term Preterm AB Living  3 3 3  0 0 3  SAB TAB Ectopic Multiple Live Births  0 0 0 0 3    # Outcome Date GA Lbr Len/2nd Weight Sex Delivery Anes PTL Lv  3 Term 01/18/14 [redacted]w[redacted]d 11:35 / 00:04 6 lb 5.6 oz (2.88 kg) M Vag-Spont None  LIV  2 Term 04/06/96    M Vag-Spont None  LIV  1 Term 04/11/93    M Vag-Spont None  LIV      Past Gynecological History: As per HPI. Periods: none Yes.   history of abnormal pap smears (last pap smear: 2016 NILM with  negative HR HPV testing) No. history of STIs.   She is currently using depo provera for contraception. Last injection 01/2016. Has been getting them since 2015  Social History:  Social History   Social History  . Marital status: Single    Spouse name: N/A  . Number of children: N/A  . Years of education: N/A   Occupational History  . Not on file.   Social History Main Topics  . Smoking status: Current Every Day Smoker    Packs/day: 1.00    Years: 26.00    Types: Cigarettes  . Smokeless tobacco: Never Used  . Alcohol use No     Comment: cocaine use until pos preg test; Marijuana until 01/16/14  . Drug use:     Frequency: 4.0 times per week    Types: Marijuana, Cocaine     Comment: no cocaine x1 year  . Sexual activity: Yes    Birth control/ protection: None, Injection     Comment: last unprotected intercourse 9/15.  pt desires tubal sterilization.   Other Topics Concern  . Not on file   Social History Narrative  . No narrative on file    Family History:  Family History  Problem Relation Age of Onset  . Cancer Sister     cervical   She denies any other female cancers and no bleeding or blood clotting disorders.  Medications Depo provera  Allergies Flagyl [metronidazole]   Physical Exam:  BP 103/77   Pulse 76   Ht 5' 3.5" (1.613 m)   Wt 125 lb 6.4 oz (56.9 kg)   BMI 21.87 kg/m  Body mass index is 21.87 kg/m. General appearance: Well nourished, well developed female in no acute distress.  Neck:  Supple, normal appearance, and no thyromegaly  Cardiovascular: normal s1 and s2.  No murmurs, rubs or gallops. Respiratory:  Clear to auscultation bilateral. Normal respiratory effort Abdomen: positive bowel sounds and no masses, hernias; diffusely non tender to palpation, non distended Breasts: breasts appear normal, no suspicious masses, no skin or nipple changes or axillary nodes and negative inspection Neuro/Psych:  Normal mood and affect.  Skin:  Warm and  dry.  Lymphatic:  No inguinal lymphadenopathy.   Pelvic exam: is not limited by body habitus EGBUS: within normal limits, Vagina: within normal limits and with no blood in the vault. +white, slightly malodorous d/c in vault Cervix: normal appearing cervix without discharge or lesions with small approx 1cm benign appearing polyp (non friable) on the external os. Uterus:  nonenlarged, and Adnexa:  normal adnexa and no mass, fullness, tenderness Rectovaginal: deferred  Laboratory: none  Radiology: none  Assessment: pt doing well. +BV, tobacco abuse  Plan:  Normal well woman exam except for the BV. Will do metrogel since has GI rxn to PO.  Follow up pap and serum and swab STI testing  Pt willing to quit smoking and this is her 2nd relapse with tobacco. Encouraged her and gave her strategies including a quit date and she is wanting to trying to quit again in the near future.   RTC 6wks for depo provera only injection visit  Durene Romans MD Attending Center for [facility])

## 2016-03-16 LAB — HEPATITIS B SURFACE ANTIGEN: Hepatitis B Surface Ag: NEGATIVE

## 2016-03-16 LAB — HIV ANTIBODY (ROUTINE TESTING W REFLEX): HIV: NONREACTIVE

## 2016-03-16 LAB — RPR

## 2016-03-16 LAB — HEPATITIS C ANTIBODY: HCV AB: NEGATIVE

## 2016-03-17 ENCOUNTER — Encounter: Payer: Self-pay | Admitting: General Practice

## 2016-03-17 ENCOUNTER — Encounter: Payer: Self-pay | Admitting: Obstetrics and Gynecology

## 2016-03-17 DIAGNOSIS — A599 Trichomoniasis, unspecified: Secondary | ICD-10-CM | POA: Insufficient documentation

## 2016-03-17 DIAGNOSIS — N871 Moderate cervical dysplasia: Secondary | ICD-10-CM | POA: Insufficient documentation

## 2016-03-17 LAB — CYTOLOGY - PAP
CHLAMYDIA, DNA PROBE: NEGATIVE
HPV: DETECTED — AB
Neisseria Gonorrhea: NEGATIVE
Trichomonas: POSITIVE — AB

## 2016-03-18 ENCOUNTER — Telehealth: Payer: Self-pay

## 2016-03-22 MED ORDER — METRONIDAZOLE 500 MG PO TABS: 500.0000 mg | ORAL_TABLET | Freq: Once | ORAL | 0 refills | Status: AC

## 2016-03-22 MED ORDER — ONDANSETRON HCL 8 MG PO TABS: 8.0000 mg | ORAL_TABLET | Freq: Once | ORAL | 0 refills | Status: AC

## 2016-03-22 NOTE — Telephone Encounter (Signed)
Per Provider patient can take Zofran 8 mg 30 min prior to taking Flagyl 2 grams to treat for trich. Medication has been called into her pharmacy. I attempted to call patient but there was no answer to leave a message.

## 2016-03-23 ENCOUNTER — Telehealth: Payer: Self-pay | Admitting: *Deleted

## 2016-03-23 NOTE — Telephone Encounter (Signed)
Patient also needs to have  a colpo with Dr.Pickens in sometime in the next 6 weeks

## 2016-03-23 NOTE — Telephone Encounter (Signed)
Pt left message yesterday @ 1753 stating that she has a question about a prescription and also wants test results.

## 2016-03-25 NOTE — Telephone Encounter (Signed)
Attempted to call patient for the second time. No answer or voicemail to leave a message.

## 2016-04-26 ENCOUNTER — Ambulatory Visit (INDEPENDENT_AMBULATORY_CARE_PROVIDER_SITE_OTHER): Payer: Medicaid Other | Admitting: *Deleted

## 2016-04-26 VITALS — BP 118/92

## 2016-04-26 DIAGNOSIS — Z3042 Encounter for surveillance of injectable contraceptive: Secondary | ICD-10-CM | POA: Diagnosis not present

## 2016-04-26 MED ORDER — MEDROXYPROGESTERONE ACETATE 150 MG/ML IM SUSP
150.0000 mg | Freq: Once | INTRAMUSCULAR | Status: AC
  Administered 2016-04-26: 150 mg via INTRAMUSCULAR

## 2016-05-26 ENCOUNTER — Encounter: Payer: Self-pay | Admitting: Obstetrics and Gynecology

## 2016-05-26 ENCOUNTER — Other Ambulatory Visit (HOSPITAL_COMMUNITY)
Admission: RE | Admit: 2016-05-26 | Discharge: 2016-05-26 | Disposition: A | Discharge status: Home / Self Care | Payer: Medicaid Other | Source: Ambulatory Visit | Attending: Obstetrics and Gynecology | Admitting: Obstetrics and Gynecology

## 2016-05-26 ENCOUNTER — Ambulatory Visit (INDEPENDENT_AMBULATORY_CARE_PROVIDER_SITE_OTHER): Payer: Medicaid Other | Admitting: Obstetrics and Gynecology

## 2016-05-26 VITALS — BP 110/77 | HR 61 | Ht 63.5 in | Wt 128.6 lb

## 2016-05-26 DIAGNOSIS — Z3202 Encounter for pregnancy test, result negative: Secondary | ICD-10-CM | POA: Diagnosis not present

## 2016-05-26 DIAGNOSIS — R87611 Atypical squamous cells cannot exclude high grade squamous intraepithelial lesion on cytologic smear of cervix (ASC-H): Secondary | ICD-10-CM

## 2016-05-26 LAB — POCT PREGNANCY, URINE: Preg Test, Ur: NEGATIVE

## 2016-05-26 NOTE — Progress Notes (Signed)
See colpo note.

## 2016-05-26 NOTE — Procedures (Addendum)
Colposcopy Procedure Note  Pre-operative Diagnosis: ASC-H pap smear  Post-operative Diagnosis: CIN 2. Cervical polyp  Procedure Details: UPT not done (patient s/p recent depo). patient also endorses any tobacco use   The risks (including infection, bleeding, pain) and benefits of the procedure were explained to the patient and written informed consent was obtained.  The patient was placed in the dorsal lithotomy position. A Graves was speculum inserted in the vagina, and the cervix was visualized.  AA staining done Lugol's with green filter.  Biopsy of the polyp was done and then the cervix at 10-11 o'clock and then single toothed tenaculum applied and ECC in all four quadrants done. No bleeding after procedure after application of Monsel's  Findings: cervical polyp erythematous, non friable and coming from the cervical os/bottom lip of the cervix. With AA staining, AWE changes seen circumferentially at the external os and TZ; polyp didn't stain with AA. Also, punctuate spots seen circumferentially; this was confirmatory on green filter and Lugol's showed mahogany staining of the above areas.   Adequate: Yes  Specimens: cervical polyp biopsy, 10-11 o'clock cervix and ECC  Condition: Stable  Complications: None  Plan: The patient was advised to call for any fever or for prolonged or severe pain or bleeding. She was advised to use OTC analgesics as needed for mild to moderate pain. She was advised to avoid vaginal intercourse for 48 hours or until the bleeding has completely stopped. D/w pt that likely will need LEEP/CKC given colpo and importance of follow up and tobacco cessation.    Durene Romans MD Attending Center for [facility] [employer])

## 2016-06-07 ENCOUNTER — Encounter: Payer: Self-pay | Admitting: Obstetrics and Gynecology

## 2016-06-07 ENCOUNTER — Telehealth: Payer: Self-pay | Admitting: Obstetrics and Gynecology

## 2016-06-07 NOTE — Telephone Encounter (Signed)
GYN Telephone Note  Patient called at [phone] and it rang a few times and then stopped, but it didn't go to VM. Patient called at [phone], and it rang several times but no answer or VM. Will try again later this week and if no success, will send letter and recommend LEEP to patient.  Durene Romans MD Attending Center for [facility] (Faculty Practice) 06/07/2016 Time: 0900

## 2016-06-08 ENCOUNTER — Telehealth: Payer: Self-pay

## 2016-06-08 DIAGNOSIS — A599 Trichomoniasis, unspecified: Secondary | ICD-10-CM

## 2016-06-08 NOTE — Telephone Encounter (Signed)
Pt called requesting a refill on her medication because she is having a smelly discharge.

## 2016-06-09 MED ORDER — METRONIDAZOLE 500 MG PO TABS: 500.0000 mg | ORAL_TABLET | Freq: Once | ORAL | 0 refills | Status: AC

## 2016-06-09 NOTE — Telephone Encounter (Signed)
Patient called into front office stating she needs a refill on her flagyl. Patient states she is concerned that she may have trich again because she is having the same symptoms and is uncertain if her partner was really treated. Told patient we will send a refill in and advised her partner get treatment as well before she is active. Patient verbalized understanding & asked about her colposcopy results. Told patient they seem okay but I cannot determine a plan of care for her & that I will send a message to Dr Ilda Basset asking what her treatment should be. Patient verbalized understanding and had no questions. Patient left phone number [phone].

## 2016-06-10 ENCOUNTER — Encounter: Payer: Self-pay | Admitting: Obstetrics and Gynecology

## 2016-06-10 ENCOUNTER — Telehealth: Payer: Self-pay | Admitting: Obstetrics and Gynecology

## 2016-06-10 NOTE — Telephone Encounter (Signed)
GYN Telephone Note Patient called back and after reviewing options with her she'd like to do LEEP. Will send inbasket message for scheduling in office LEEP.  Durene Romans MD Attending Center for [facility] (Faculty Practice) 06/10/2016 Time: 1230pm

## 2016-06-10 NOTE — Progress Notes (Signed)
GYN Telephone Note  Patient called at [phone] and it rang and then went to VM. Brief VM left telling patient that I recommend doing a LEEP and to call the office back to go over that more in detail and set up an appointment for that.   Durene Romans MD Attending Center for [facility] (Faculty Practice) 06/10/2016 Time: 1234pm

## 2016-07-12 ENCOUNTER — Ambulatory Visit: Payer: Medicaid Other

## 2016-07-13 ENCOUNTER — Ambulatory Visit: Payer: Medicaid Other | Admitting: Obstetrics and Gynecology

## 2016-07-13 ENCOUNTER — Ambulatory Visit (INDEPENDENT_AMBULATORY_CARE_PROVIDER_SITE_OTHER): Payer: Medicaid Other

## 2016-07-13 DIAGNOSIS — Z3042 Encounter for surveillance of injectable contraceptive: Secondary | ICD-10-CM

## 2016-07-13 MED ORDER — MEDROXYPROGESTERONE ACETATE 150 MG/ML IM SUSP
150.0000 mg | Freq: Once | INTRAMUSCULAR | Status: AC
  Administered 2016-07-13: 150 mg via INTRAMUSCULAR

## 2016-08-19 ENCOUNTER — Ambulatory Visit: Payer: Medicaid Other | Admitting: Obstetrics and Gynecology

## 2016-08-19 ENCOUNTER — Telehealth: Payer: Self-pay | Admitting: *Deleted

## 2016-08-19 NOTE — Telephone Encounter (Signed)
Called patient d/t missed appointment for LEEP. There was no answer, vm left stating calling to reschedule.

## 2016-08-20 NOTE — Telephone Encounter (Signed)
Patient returned call

## 2016-09-23 ENCOUNTER — Ambulatory Visit: Payer: Medicaid Other | Admitting: Obstetrics and Gynecology

## 2016-09-28 ENCOUNTER — Ambulatory Visit: Payer: Medicaid Other

## 2016-10-05 ENCOUNTER — Ambulatory Visit (INDEPENDENT_AMBULATORY_CARE_PROVIDER_SITE_OTHER): Payer: Medicaid Other | Admitting: *Deleted

## 2016-10-05 VITALS — BP 105/74 | HR 74 | Wt 129.5 lb

## 2016-10-05 DIAGNOSIS — Z3042 Encounter for surveillance of injectable contraceptive: Secondary | ICD-10-CM | POA: Diagnosis present

## 2016-10-05 MED ORDER — MEDROXYPROGESTERONE ACETATE 150 MG/ML IM SUSP
150.0000 mg | Freq: Once | INTRAMUSCULAR | Status: AC
  Administered 2016-10-05: 150 mg via INTRAMUSCULAR

## 2016-10-05 NOTE — Progress Notes (Signed)
Depo Provera administered as scheduled.  Pt tolerated well.  Next injection due 7/24-8/7. Pap due after 03/15/17.

## 2016-12-21 ENCOUNTER — Other Ambulatory Visit (HOSPITAL_COMMUNITY)
Admission: RE | Admit: 2016-12-21 | Discharge: 2016-12-21 | Disposition: A | Discharge status: Home / Self Care | Payer: Medicaid Other | Source: Ambulatory Visit | Attending: Family Medicine | Admitting: Family Medicine

## 2016-12-21 ENCOUNTER — Ambulatory Visit (INDEPENDENT_AMBULATORY_CARE_PROVIDER_SITE_OTHER): Payer: Medicaid Other

## 2016-12-21 VITALS — BP 116/76 | HR 74

## 2016-12-21 DIAGNOSIS — Z3042 Encounter for surveillance of injectable contraceptive: Secondary | ICD-10-CM

## 2016-12-21 DIAGNOSIS — Z113 Encounter for screening for infections with a predominantly sexual mode of transmission: Secondary | ICD-10-CM | POA: Diagnosis not present

## 2016-12-21 MED ORDER — MEDROXYPROGESTERONE ACETATE 150 MG/ML IM SUSP
150.0000 mg | Freq: Once | INTRAMUSCULAR | Status: AC
  Administered 2016-12-21: 150 mg via INTRAMUSCULAR

## 2016-12-21 NOTE — Progress Notes (Addendum)
Patient presented to office for Depo injection. Injection was given. Patient tolerated well.Patient requested STD testing. Patient left urine sample for STD testing. Informed patient that I will call her if results are abnormal. Patient verbalized understanding.

## 2016-12-22 LAB — GC/CHLAMYDIA PROBE AMP (~~LOC~~) NOT AT ARMC
CHLAMYDIA, DNA PROBE: NEGATIVE
NEISSERIA GONORRHEA: NEGATIVE

## 2016-12-22 LAB — HIV ANTIBODY (ROUTINE TESTING W REFLEX): HIV SCREEN 4TH GENERATION: NONREACTIVE

## 2016-12-22 LAB — RPR: RPR Ser Ql: NONREACTIVE

## 2017-03-08 ENCOUNTER — Ambulatory Visit (INDEPENDENT_AMBULATORY_CARE_PROVIDER_SITE_OTHER): Payer: Medicaid Other | Admitting: General Practice

## 2017-03-08 VITALS — BP 119/76 | HR 55 | Ht 63.0 in | Wt 135.0 lb

## 2017-03-08 DIAGNOSIS — Z3042 Encounter for surveillance of injectable contraceptive: Secondary | ICD-10-CM

## 2017-03-08 MED ORDER — MEDROXYPROGESTERONE ACETATE 150 MG/ML IM SUSP
150.0000 mg | Freq: Once | INTRAMUSCULAR | Status: AC
  Administered 2017-03-08: 150 mg via INTRAMUSCULAR

## 2017-03-22 ENCOUNTER — Encounter: Payer: Self-pay | Admitting: Advanced Practice Midwife

## 2017-03-22 ENCOUNTER — Other Ambulatory Visit (HOSPITAL_COMMUNITY)
Admission: RE | Admit: 2017-03-22 | Discharge: 2017-03-22 | Disposition: A | Discharge status: Home / Self Care | Payer: Medicaid Other | Source: Ambulatory Visit | Attending: Advanced Practice Midwife | Admitting: Advanced Practice Midwife

## 2017-03-22 ENCOUNTER — Ambulatory Visit (INDEPENDENT_AMBULATORY_CARE_PROVIDER_SITE_OTHER): Payer: Medicaid Other | Admitting: Advanced Practice Midwife

## 2017-03-22 VITALS — BP 110/83 | HR 75 | Ht 63.5 in | Wt 137.7 lb

## 2017-03-22 DIAGNOSIS — Z1151 Encounter for screening for human papillomavirus (HPV): Secondary | ICD-10-CM | POA: Diagnosis not present

## 2017-03-22 DIAGNOSIS — Z124 Encounter for screening for malignant neoplasm of cervix: Secondary | ICD-10-CM

## 2017-03-22 DIAGNOSIS — Z113 Encounter for screening for infections with a predominantly sexual mode of transmission: Secondary | ICD-10-CM | POA: Diagnosis not present

## 2017-03-22 DIAGNOSIS — Z01419 Encounter for gynecological examination (general) (routine) without abnormal findings: Secondary | ICD-10-CM | POA: Insufficient documentation

## 2017-03-22 DIAGNOSIS — Z1239 Encounter for other screening for malignant neoplasm of breast: Secondary | ICD-10-CM

## 2017-03-22 NOTE — Patient Instructions (Signed)
Some natural remedies/prevention to try for bacterial vaginosis: °Take a probiotic tablet/capsule every day for at least 1-2 months.   °Whenever you have symptoms, use boric acid suppositories vaginally every night for a week.   °Do not use scented soaps/perfumes in the vaginal area and wear breathable cotton underwear and do not wear tight restrictive clothing. ° ° °

## 2017-03-22 NOTE — Progress Notes (Signed)
Subjective:    Cindy Hernandez is a 41 y.o. female who presents for an annual exam. The patient has no complaints today. The patient is not currently sexually active. GYN screening history: last pap: was abnormal: Followed by normal colpo. The patient wears seatbelts: yes. The patient participates in regular exercise: yes.The patient reports that there is not domestic violence in her life.   Menstrual History: OB History    Gravida Para Term Preterm AB Living   3 3 3  0 0 3   SAB TAB Ectopic Multiple Live Births   0 0 0 0 3       No LMP recorded. Patient has had an injection.    The following portions of the patient's history were reviewed and updated as appropriate: allergies, current medications, past family history, past medical history, past social history, past surgical history and problem list.  Review of Systems Pertinent items noted in HPI and remainder of comprehensive ROS otherwise negative.    Objective:   BP 110/83   Pulse 75   Ht 5' 3.5" (1.613 m)   Wt 137 lb 11.2 oz (62.5 kg)   BMI 24.01 kg/m  CONSTITUTIONAL: Well-developed, well-nourished female in no acute distress.  HENT:  Normocephalic, atraumatic, External right and left ear normal. Oropharynx is clear and moist EYES: Conjunctivae and EOM are normal. Pupils are equal, round, and reactive to light. No scleral icterus.  NECK: Normal range of motion, supple, no masses.  Normal thyroid.  SKIN: Skin is warm and dry. No rash noted. Not diaphoretic. No erythema. No pallor. [place]: Alert and oriented to person, place, and time. Normal reflexes, muscle tone coordination. No cranial nerve deficit noted. PSYCHIATRIC: Normal mood and affect. Normal behavior. Normal judgment and thought content. CARDIOVASCULAR: Normal heart rate noted, regular rhythm RESPIRATORY: Clear to auscultation bilaterally. Effort and breath sounds normal, no problems with respiration noted. BREASTS: Symmetric in size. No masses, skin changes,  nipple drainage, or lymphadenopathy. ABDOMEN: Soft, normal bowel sounds, no distention noted.  No tenderness, rebound or guarding.  PELVIC: Normal appearing external genitalia; normal appearing vaginal mucosa and cervix.  Normal appearing discharge.  Pap smear obtained.  Normal uterine size, no other palpable masses, no uterine or adnexal tenderness. MUSCULOSKELETAL: Normal range of motion. No tenderness.  No cyanosis, clubbing, or edema.  2+ distal pulses.   Assessment:   1. Well woman exam with routine gynecological exam  - Cytology - PAP  2. Screening for STDs (sexually transmitted diseases)   3. Pap smear for cervical cancer screening   4. Screening for breast cancer  - MM Digital Screening; Future    Plan:     All questions answered. Await pap smear results. Follow up as needed.   Fatima Blank, CNM 4:49 PM

## 2017-03-28 ENCOUNTER — Other Ambulatory Visit: Payer: Self-pay | Admitting: Advanced Practice Midwife

## 2017-03-28 LAB — CYTOLOGY - PAP
Bacterial vaginitis: POSITIVE — AB
CHLAMYDIA, DNA PROBE: NEGATIVE
Candida vaginitis: NEGATIVE
DIAGNOSIS: UNDETERMINED — AB
HPV (WINDOPATH): DETECTED
NEISSERIA GONORRHEA: NEGATIVE
TRICH (WINDOWPATH): POSITIVE — AB

## 2017-03-28 MED ORDER — ONDANSETRON 8 MG PO TBDP: 8.0000 mg | ORAL_TABLET | Freq: Three times a day (TID) | ORAL | 0 refills | Status: DC | PRN

## 2017-03-28 MED ORDER — METRONIDAZOLE 500 MG PO TABS: ORAL_TABLET | ORAL | 0 refills | Status: DC

## 2017-03-28 NOTE — Progress Notes (Signed)
Pt positive for trichomonas on Pap on 10/23.  Flagyl 2 g send to pharmacy with Zofran 8 mg ODt to take b/c of hx of nausea and vomiting with Flagyl.

## 2017-04-12 ENCOUNTER — Ambulatory Visit: Payer: Medicaid Other

## 2017-05-30 ENCOUNTER — Ambulatory Visit: Payer: Medicaid Other

## 2017-06-01 ENCOUNTER — Ambulatory Visit: Payer: Medicaid Other

## 2017-06-02 ENCOUNTER — Ambulatory Visit: Payer: Medicaid Other

## 2017-06-03 ENCOUNTER — Ambulatory Visit (INDEPENDENT_AMBULATORY_CARE_PROVIDER_SITE_OTHER): Payer: Medicaid Other

## 2017-06-03 VITALS — BP 111/78 | HR 79 | Wt 135.6 lb

## 2017-06-03 DIAGNOSIS — Z3042 Encounter for surveillance of injectable contraceptive: Secondary | ICD-10-CM

## 2017-06-03 MED ORDER — MEDROXYPROGESTERONE ACETATE 150 MG/ML IM SUSP
150.0000 mg | Freq: Once | INTRAMUSCULAR | Status: AC
  Administered 2017-06-03: 150 mg via INTRAMUSCULAR

## 2017-06-03 NOTE — Progress Notes (Signed)
DEPO shot given in right deltoid today on 06/03/17.

## 2017-06-03 NOTE — Progress Notes (Signed)
I have reviewed this chart and agree with the RN/CMA assessment and management.    K. Meryl Davis, M.D. Attending Obstetrician & Gynecologist, [facility], [facility]  

## 2017-08-19 ENCOUNTER — Ambulatory Visit: Payer: Medicaid Other

## 2017-08-22 ENCOUNTER — Ambulatory Visit (INDEPENDENT_AMBULATORY_CARE_PROVIDER_SITE_OTHER): Payer: Medicaid Other

## 2017-08-22 VITALS — BP 134/76 | HR 77

## 2017-08-22 DIAGNOSIS — Z3042 Encounter for surveillance of injectable contraceptive: Secondary | ICD-10-CM

## 2017-08-22 MED ORDER — MEDROXYPROGESTERONE ACETATE 150 MG/ML IM SUSP
150.0000 mg | Freq: Once | INTRAMUSCULAR | Status: AC
  Administered 2017-08-22: 150 mg via INTRAMUSCULAR

## 2017-08-22 NOTE — Progress Notes (Signed)
Chart reviewed - agree with CMA documentation.   

## 2017-08-22 NOTE — Progress Notes (Signed)
Pt presented to the office for a depo injection.Injection tolerated well. Next depo due June 10-June 24.

## 2017-11-07 ENCOUNTER — Ambulatory Visit (INDEPENDENT_AMBULATORY_CARE_PROVIDER_SITE_OTHER): Payer: Medicaid Other | Admitting: General Practice

## 2017-11-07 VITALS — BP 116/87 | HR 85 | Ht 63.0 in | Wt 130.0 lb

## 2017-11-07 DIAGNOSIS — Z3042 Encounter for surveillance of injectable contraceptive: Secondary | ICD-10-CM

## 2017-11-07 MED ORDER — MEDROXYPROGESTERONE ACETATE 150 MG/ML IM SUSP
150.0000 mg | Freq: Once | INTRAMUSCULAR | Status: AC
  Administered 2017-11-07: 150 mg via INTRAMUSCULAR

## 2017-11-07 NOTE — Progress Notes (Signed)
I have reviewed the chart and agree with nursing staff's documentation of this patient's encounter.  Marcille Buffy, CNM 11/07/2017 3:35 PM

## 2017-11-07 NOTE — Progress Notes (Signed)
Cindy Hernandez here for Depo-Provera  Injection.  Injection administered without complication. Patient will return in 3 months for next injection.  Derinda Late, RN 11/07/2017  2:51 PM

## 2018-01-23 ENCOUNTER — Ambulatory Visit (INDEPENDENT_AMBULATORY_CARE_PROVIDER_SITE_OTHER): Payer: Medicaid Other | Admitting: General Practice

## 2018-01-23 VITALS — BP 118/81 | HR 71 | Ht 63.0 in | Wt 129.0 lb

## 2018-01-23 DIAGNOSIS — Z3042 Encounter for surveillance of injectable contraceptive: Secondary | ICD-10-CM | POA: Diagnosis not present

## 2018-01-23 MED ORDER — MEDROXYPROGESTERONE ACETATE 150 MG/ML IM SUSP
150.0000 mg | Freq: Once | INTRAMUSCULAR | Status: AC
  Administered 2018-01-23: 150 mg via INTRAMUSCULAR

## 2018-01-23 NOTE — Progress Notes (Signed)
Cindy Hernandez here for Depo-Provera  Injection.  Injection administered without complication. Patient will return in 3 months for next injection.  Derinda Late, RN 01/23/2018  1:52 PM

## 2018-04-10 ENCOUNTER — Ambulatory Visit: Payer: Medicaid Other

## 2018-04-10 ENCOUNTER — Ambulatory Visit (INDEPENDENT_AMBULATORY_CARE_PROVIDER_SITE_OTHER): Payer: Medicaid Other | Admitting: Family Medicine

## 2018-04-10 ENCOUNTER — Other Ambulatory Visit (HOSPITAL_COMMUNITY)
Admission: RE | Admit: 2018-04-10 | Discharge: 2018-04-10 | Disposition: A | Discharge status: Home / Self Care | Payer: Medicaid Other | Source: Ambulatory Visit | Attending: Family Medicine | Admitting: Family Medicine

## 2018-04-10 ENCOUNTER — Encounter: Payer: Self-pay | Admitting: Family Medicine

## 2018-04-10 ENCOUNTER — Other Ambulatory Visit (HOSPITAL_COMMUNITY)
Admission: RE | Admit: 2018-04-10 | Discharge: 2018-04-10 | Disposition: A | Discharge status: Home / Self Care | Payer: Medicaid Other | Source: Ambulatory Visit | Attending: Obstetrics & Gynecology | Admitting: Obstetrics & Gynecology

## 2018-04-10 VITALS — BP 133/80 | HR 56 | Ht 63.5 in | Wt 132.5 lb

## 2018-04-10 DIAGNOSIS — Z01419 Encounter for gynecological examination (general) (routine) without abnormal findings: Secondary | ICD-10-CM | POA: Insufficient documentation

## 2018-04-10 DIAGNOSIS — N841 Polyp of cervix uteri: Secondary | ICD-10-CM | POA: Insufficient documentation

## 2018-04-10 DIAGNOSIS — Z0000 Encounter for general adult medical examination without abnormal findings: Secondary | ICD-10-CM | POA: Diagnosis not present

## 2018-04-10 DIAGNOSIS — Z3042 Encounter for surveillance of injectable contraceptive: Secondary | ICD-10-CM | POA: Diagnosis not present

## 2018-04-10 MED ORDER — MEDROXYPROGESTERONE ACETATE 150 MG/ML IM SUSP: 150.0000 mg | INTRAMUSCULAR | 0 refills | Status: DC

## 2018-04-10 MED ORDER — MEDROXYPROGESTERONE ACETATE 150 MG/ML IM SUSP
150.0000 mg | Freq: Once | INTRAMUSCULAR | Status: AC
  Administered 2018-04-10: 150 mg via INTRAMUSCULAR

## 2018-04-10 NOTE — Patient Instructions (Signed)
Preventive Care 18-39 Years, Female Preventive care refers to lifestyle choices and visits with your health care provider that can promote health and wellness. What does preventive care include?  A yearly physical exam. This is also called an annual well check.  Dental exams once or twice a year.  Routine eye exams. Ask your health care provider how often you should have your eyes checked.  Personal lifestyle choices, including: ? Daily care of your teeth and gums. ? Regular physical activity. ? Eating a healthy diet. ? Avoiding tobacco and drug use. ? Limiting alcohol use. ? Practicing safe sex. ? Taking vitamin and mineral supplements as recommended by your health care provider. What happens during an annual well check? The services and screenings done by your health care provider during your annual well check will depend on your age, overall health, lifestyle risk factors, and family history of disease. Counseling Your health care provider may ask you questions about your:  Alcohol use.  Tobacco use.  Drug use.  Emotional well-being.  Home and relationship well-being.  Sexual activity.  Eating habits.  Work and work Statistician.  Method of birth control.  Menstrual cycle.  Pregnancy history.  Screening You may have the following tests or measurements:  Height, weight, and BMI.  Diabetes screening. This is done by checking your blood sugar (glucose) after you have not eaten for a while (fasting).  Blood pressure.  Lipid and cholesterol levels. These may be checked every 5 years starting at age 66.  Skin check.  Hepatitis C blood test.  Hepatitis B blood test.  Sexually transmitted disease (STD) testing.  BRCA-related cancer screening. This may be done if you have a family history of breast, ovarian, tubal, or peritoneal cancers.  Pelvic exam and Pap test. This may be done every 3 years starting at age 40. Starting at age 59, this may be done every 5  years if you have a Pap test in combination with an HPV test.  Discuss your test results, treatment options, and if necessary, the need for more tests with your health care provider. Vaccines Your health care provider may recommend certain vaccines, such as:  Influenza vaccine. This is recommended every year.  Tetanus, diphtheria, and acellular pertussis (Tdap, Td) vaccine. You may need a Td booster every 10 years.  Varicella vaccine. You may need this if you have not been vaccinated.  HPV vaccine. If you are 69 or younger, you may need three doses over 6 months.  Measles, mumps, and rubella (MMR) vaccine. You may need at least one dose of MMR. You may also need a second dose.  Pneumococcal 13-valent conjugate (PCV13) vaccine. You may need this if you have certain conditions and were not previously vaccinated.  Pneumococcal polysaccharide (PPSV23) vaccine. You may need one or two doses if you smoke cigarettes or if you have certain conditions.  Meningococcal vaccine. One dose is recommended if you are age 27-21 years and a first-year college student living in a residence hall, or if you have one of several medical conditions. You may also need additional booster doses.  Hepatitis A vaccine. You may need this if you have certain conditions or if you travel or work in places where you may be exposed to hepatitis A.  Hepatitis B vaccine. You may need this if you have certain conditions or if you travel or work in places where you may be exposed to hepatitis B.  Haemophilus influenzae type b (Hib) vaccine. You may need this if  you have certain risk factors.  Talk to your health care provider about which screenings and vaccines you need and how often you need them. This information is not intended to replace advice given to you by your health care provider. Make sure you discuss any questions you have with your health care provider. Document Released: 07/13/2001 Document Revised: 02/04/2016  Document Reviewed: 03/18/2015 Elsevier Interactive Patient Education  [employer].

## 2018-04-10 NOTE — Progress Notes (Signed)
  Subjective:     Cindy Hernandez is a 42 y.o. female and is here for a comprehensive physical exam. The patient reports no problems. No cycles while on Depo. Works in call center. Wants STD screen. Has h/o abnl pap with colpo 2 years ago. 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  The following portions of the patient's history were reviewed and updated as appropriate: allergies, current medications, past family history, past medical history, past social history, past surgical history and problem list.  Review of Systems Pertinent items noted in HPI and remainder of comprehensive ROS otherwise negative.   Objective:    BP 133/80   Pulse (!) 56   Ht 5' 3.5" (1.613 m)   Wt 132 lb 8 oz (60.1 kg)   BMI 23.10 kg/m  General appearance: alert, cooperative and appears stated age Head: Normocephalic, without obvious abnormality, atraumatic Neck: no adenopathy, supple, symmetrical, trachea midline and thyroid not enlarged, symmetric, no tenderness/mass/nodules Lungs: clear to auscultation bilaterally Breasts: normal appearance, no masses or tenderness Heart: regular rate and rhythm, S1, S2 normal, no murmur, click, rub or gallop Abdomen: soft, non-tender; bowel sounds normal; no masses,  no organomegaly Pelvic: external genitalia normal, no adnexal masses or tenderness, no cervical motion tenderness, uterus normal size, shape, and consistency, vagina normal without discharge and cervical polyp Extremities: extremities normal, atraumatic, no cyanosis or edema Pulses: 2+ and symmetric Skin: Skin color, texture, turgor normal. No rashes or lesions Lymph nodes: Cervical, supraclavicular, and axillary nodes normal. Neurologic: Grossly normal    Procedure: Cervix visualized and polyp noted.  After pap smear obtained.  Ring forcep applied to cervix and twisting motion removed polyp intact.  Hemostasis obtained with Monsel's solution.  Assessment:    Healthy female exam.      Plan:       Problem List Items Addressed This Visit    None    Visit Diagnoses    Well woman exam    -  Primary   Relevant Orders   Cytology - PAP   CBC (Completed)   TSH (Completed)   Comprehensive metabolic panel (Completed)   Hemoglobin A1c (Completed)   Hepatitis B surface antigen (Completed)   Hepatitis C antibody (Completed)   HIV Antibody (routine testing w rflx) (Completed)   RPR (Completed)   Lipid panel (Completed)   MM DIGITAL SCREENING BILATERAL   Cervical polyp       Relevant Orders   Surgical pathology    Wants STD testing, declines flu shot Screening mammogram--has f/h of breast cancer in maternal aunt. Continue Depo--wants one more baby  See After Visit Summary for Counseling Recommendations

## 2018-04-11 ENCOUNTER — Encounter: Payer: Self-pay | Admitting: Family Medicine

## 2018-04-11 LAB — HIV ANTIBODY (ROUTINE TESTING W REFLEX): HIV SCREEN 4TH GENERATION: NONREACTIVE

## 2018-04-11 LAB — CBC
Hematocrit: 36.9 % (ref 34.0–46.6)
Hemoglobin: 12.3 g/dL (ref 11.1–15.9)
MCH: 33 pg (ref 26.6–33.0)
MCHC: 33.3 g/dL (ref 31.5–35.7)
MCV: 99 fL — AB (ref 79–97)
PLATELETS: 327 10*3/uL (ref 150–450)
RBC: 3.73 x10E6/uL — ABNORMAL LOW (ref 3.77–5.28)
RDW: 12.3 % (ref 12.3–15.4)
WBC: 8.1 10*3/uL (ref 3.4–10.8)

## 2018-04-11 LAB — COMPREHENSIVE METABOLIC PANEL
ALK PHOS: 71 IU/L (ref 39–117)
ALT: 12 IU/L (ref 0–32)
AST: 15 IU/L (ref 0–40)
Albumin/Globulin Ratio: 1.5 (ref 1.2–2.2)
Albumin: 4.2 g/dL (ref 3.5–5.5)
BILIRUBIN TOTAL: 0.5 mg/dL (ref 0.0–1.2)
BUN/Creatinine Ratio: 10 (ref 9–23)
BUN: 8 mg/dL (ref 6–24)
CO2: 20 mmol/L (ref 20–29)
CREATININE: 0.84 mg/dL (ref 0.57–1.00)
Calcium: 9.4 mg/dL (ref 8.7–10.2)
Chloride: 107 mmol/L — ABNORMAL HIGH (ref 96–106)
GFR calc Af Amer: 100 mL/min/{1.73_m2} (ref >59)
GFR calc non Af Amer: 87 mL/min/{1.73_m2} (ref >59)
GLOBULIN, TOTAL: 2.8 g/dL (ref 1.5–4.5)
GLUCOSE: 84 mg/dL (ref 65–99)
POTASSIUM: 3.7 mmol/L (ref 3.5–5.2)
SODIUM: 142 mmol/L (ref 134–144)
Total Protein: 7 g/dL (ref 6.0–8.5)

## 2018-04-11 LAB — RPR: RPR: NONREACTIVE

## 2018-04-11 LAB — HEMOGLOBIN A1C
Est. average glucose Bld gHb Est-mCnc: 100 mg/dL
Hgb A1c MFr Bld: 5.1 % (ref 4.8–5.6)

## 2018-04-11 LAB — HEPATITIS B SURFACE ANTIGEN: Hepatitis B Surface Ag: NEGATIVE

## 2018-04-11 LAB — LIPID PANEL
Chol/HDL Ratio: 4.4 ratio (ref 0.0–4.4)
Cholesterol, Total: 243 mg/dL — ABNORMAL HIGH (ref 100–199)
HDL: 55 mg/dL (ref >39)
LDL Calculated: 166 mg/dL — ABNORMAL HIGH (ref 0–99)
TRIGLYCERIDES: 110 mg/dL (ref 0–149)
VLDL CHOLESTEROL CAL: 22 mg/dL (ref 5–40)

## 2018-04-11 LAB — HEPATITIS C ANTIBODY: Hep C Virus Ab: <0.1 s/co ratio (ref 0.0–0.9)

## 2018-04-11 LAB — TSH: TSH: 0.426 u[IU]/mL — ABNORMAL LOW (ref 0.450–4.500)

## 2018-04-14 LAB — CYTOLOGY - PAP
CHLAMYDIA, DNA PROBE: POSITIVE — AB
DIAGNOSIS: UNDETERMINED — AB
HPV (WINDOPATH): DETECTED — AB
HPV 16/18/45 GENOTYPING: NEGATIVE
NEISSERIA GONORRHEA: NEGATIVE
Trichomonas: POSITIVE — AB

## 2018-04-16 MED ORDER — AZITHROMYCIN 250 MG PO TABS: 1000.0000 mg | ORAL_TABLET | Freq: Once | ORAL | 1 refills | Status: AC

## 2018-04-16 NOTE — Addendum Note (Signed)
Addended by: Donnamae Jude on: 04/16/2018 10:47 AM   Modules accepted: Orders

## 2018-04-17 ENCOUNTER — Encounter: Payer: Self-pay | Admitting: *Deleted

## 2018-04-17 ENCOUNTER — Telehealth: Payer: Self-pay | Admitting: *Deleted

## 2018-04-17 DIAGNOSIS — A749 Chlamydial infection, unspecified: Secondary | ICD-10-CM

## 2018-04-17 DIAGNOSIS — A599 Trichomoniasis, unspecified: Secondary | ICD-10-CM

## 2018-04-17 MED ORDER — TINIDAZOLE 500 MG PO TABS: 2.0000 g | ORAL_TABLET | Freq: Once | ORAL | 1 refills | Status: AC

## 2018-04-17 MED ORDER — AZITHROMYCIN 250 MG PO TABS: 1000.0000 mg | ORAL_TABLET | Freq: Once | ORAL | 1 refills | Status: AC

## 2018-04-17 NOTE — Telephone Encounter (Signed)
-----   Message from Donnamae Jude, MD sent at 04/16/2018 10:47 AM EST ----- + for chlamydia, trich, and abnormal pap and needs colpo Rx sent in and for partner therapy--allergic to flagyl--can she take Tindamax???? If so send this in--please call and let patient know.

## 2018-04-17 NOTE — Progress Notes (Signed)
STD Card completed

## 2018-04-17 NOTE — Telephone Encounter (Signed)
Called pt to notify her of her abnormal pap smear and need for a colposcopy. Explained a colonoscopy to the pt and advised her that the front office staff would be contacting her to schedule her appointment.  Pt also informed of positive chlamydia and trich.  Pt reported that she has never taken Tindamax and therefore does not know if it causes the nausea and vomiting that flagyl does.  Pt will take the medication.  Tindamax 2g and Zithromax 1 gm called into the [facility] on [address].  Expedited partner therapy explained to pt. Pt instructed to take the medication with food and to abstain from sex until 2 weeks after the last person was treated.  Pt verbalized understanding.   Free T3 and free T4 added to pt's previous labs.

## 2018-05-01 ENCOUNTER — Telehealth: Payer: Self-pay | Admitting: *Deleted

## 2018-05-01 NOTE — Telephone Encounter (Signed)
I called Lucca back and she states she had some alcohol on Thanksgiving and thought she needed to repeat her medicine for tx trich. I explained to her not as far as I know. We do recommend no intercourse until after treatment x2 weeks.She denies intercourse since treatment. I advised call us back or come to after hours if has symptoms and wants to be retested. She voices understanding.

## 2018-05-01 NOTE — Telephone Encounter (Signed)
Received a voice mail from 04/28/18 2:14pm stating she needs another medication refill

## 2018-05-08 ENCOUNTER — Ambulatory Visit: Payer: Medicaid Other | Admitting: Family Medicine

## 2018-05-25 ENCOUNTER — Encounter (HOSPITAL_COMMUNITY): Payer: Self-pay | Admitting: Emergency Medicine

## 2018-05-25 ENCOUNTER — Other Ambulatory Visit: Payer: Self-pay

## 2018-05-25 ENCOUNTER — Emergency Department (HOSPITAL_COMMUNITY)
Admission: EM | Admit: 2018-05-25 | Discharge: 2018-05-26 | Disposition: A | Discharge status: Home / Self Care | Payer: Medicaid Other | Attending: Emergency Medicine | Admitting: Emergency Medicine

## 2018-05-25 DIAGNOSIS — N12 Tubulo-interstitial nephritis, not specified as acute or chronic: Secondary | ICD-10-CM | POA: Diagnosis not present

## 2018-05-25 DIAGNOSIS — F17210 Nicotine dependence, cigarettes, uncomplicated: Secondary | ICD-10-CM | POA: Insufficient documentation

## 2018-05-25 DIAGNOSIS — R079 Chest pain, unspecified: Secondary | ICD-10-CM | POA: Diagnosis not present

## 2018-05-25 DIAGNOSIS — R112 Nausea with vomiting, unspecified: Secondary | ICD-10-CM | POA: Diagnosis not present

## 2018-05-25 DIAGNOSIS — R109 Unspecified abdominal pain: Secondary | ICD-10-CM | POA: Diagnosis not present

## 2018-05-25 DIAGNOSIS — R1031 Right lower quadrant pain: Secondary | ICD-10-CM | POA: Diagnosis present

## 2018-05-25 LAB — COMPREHENSIVE METABOLIC PANEL
ALT: 21 U/L (ref 0–44)
AST: 34 U/L (ref 15–41)
Albumin: 3.8 g/dL (ref 3.5–5.0)
Alkaline Phosphatase: 61 U/L (ref 38–126)
Anion gap: 5 (ref 5–15)
BILIRUBIN TOTAL: 0.8 mg/dL (ref 0.3–1.2)
BUN: 7 mg/dL (ref 6–20)
CO2: 27 mmol/L (ref 22–32)
Calcium: 8.8 mg/dL — ABNORMAL LOW (ref 8.9–10.3)
Chloride: 107 mmol/L (ref 98–111)
Creatinine, Ser: 0.8 mg/dL (ref 0.44–1.00)
Glucose, Bld: 96 mg/dL (ref 70–99)
Potassium: 3.3 mmol/L — ABNORMAL LOW (ref 3.5–5.1)
Sodium: 139 mmol/L (ref 135–145)
Total Protein: 7.1 g/dL (ref 6.5–8.1)

## 2018-05-25 LAB — CBC
HCT: 37.4 % (ref 36.0–46.0)
Hemoglobin: 12.7 g/dL (ref 12.0–15.0)
MCH: 34.4 pg — ABNORMAL HIGH (ref 26.0–34.0)
MCHC: 34 g/dL (ref 30.0–36.0)
MCV: 101.4 fL — ABNORMAL HIGH (ref 80.0–100.0)
NRBC: 0 % (ref 0.0–0.2)
Platelets: 285 10*3/uL (ref 150–400)
RBC: 3.69 MIL/uL — ABNORMAL LOW (ref 3.87–5.11)
RDW: 12.3 % (ref 11.5–15.5)
WBC: 4.2 10*3/uL (ref 4.0–10.5)

## 2018-05-25 LAB — URINALYSIS, ROUTINE W REFLEX MICROSCOPIC
Bilirubin Urine: NEGATIVE
GLUCOSE, UA: NEGATIVE mg/dL
Ketones, ur: 5 mg/dL — AB
Nitrite: POSITIVE — AB
Protein, ur: 30 mg/dL — AB
SPECIFIC GRAVITY, URINE: 1.025 (ref 1.005–1.030)
pH: 6 (ref 5.0–8.0)

## 2018-05-25 LAB — LIPASE, BLOOD: Lipase: 25 U/L (ref 11–51)

## 2018-05-25 LAB — I-STAT BETA HCG BLOOD, ED (MC, WL, AP ONLY): I-stat hCG, quantitative: <5 m[IU]/mL (ref <5)

## 2018-05-25 MED ORDER — ONDANSETRON 4 MG PO TBDP
4.0000 mg | ORAL_TABLET | Freq: Once | ORAL | Status: AC
  Administered 2018-05-26: 4 mg via ORAL
  Filled 2018-05-25: qty 1

## 2018-05-25 MED ORDER — IBUPROFEN 400 MG PO TABS
600.0000 mg | ORAL_TABLET | Freq: Once | ORAL | Status: AC
  Administered 2018-05-26: 600 mg via ORAL
  Filled 2018-05-25: qty 1

## 2018-05-25 MED ORDER — ONDANSETRON 4 MG PO TBDP: 4.0000 mg | ORAL_TABLET | Freq: Three times a day (TID) | ORAL | 0 refills | Status: DC | PRN

## 2018-05-25 MED ORDER — CEPHALEXIN 250 MG PO CAPS
1000.0000 mg | ORAL_CAPSULE | Freq: Once | ORAL | Status: AC
  Administered 2018-05-26: 1000 mg via ORAL
  Filled 2018-05-25: qty 4

## 2018-05-25 MED ORDER — CEPHALEXIN 500 MG PO CAPS: 1000.0000 mg | ORAL_CAPSULE | Freq: Two times a day (BID) | ORAL | 0 refills | Status: DC

## 2018-05-25 MED ORDER — IBUPROFEN 600 MG PO TABS: 600.0000 mg | ORAL_TABLET | Freq: Four times a day (QID) | ORAL | 0 refills | Status: DC | PRN

## 2018-05-25 NOTE — ED Triage Notes (Signed)
Pt reports R side abdominal pain that radiates to back and down R leg since last night. Pt reports nausea and 1 episode of vomiting yesterday. She denies changes in urination. Pt reports diarrhea. Pt denies fever.

## 2018-05-25 NOTE — ED Provider Notes (Signed)
[facility] EMERGENCY DEPARTMENT Provider Note   CSN: [id number] Arrival date & time: 05/25/18  1837     History   Chief Complaint Chief Complaint  Patient presents with  . Back Pain  . Abdominal Pain    HPI Cindy Hernandez is a 42 y.o. female.  Patient to ED with symptoms pain in the right side of her body from right lateral chest wall, to flank to RLQ abdomen, into the lower extremity. No spinal pain or injury. She states she does not feel well but denies URI symptoms, fever, diarrhea, vaginal symptoms. She has had nausea without only one episode of vomiting. No dysuria or frequency. No hematuria.   The history is provided by the patient. No language interpreter was used.  Back Pain   Associated symptoms include chest pain (right lateral chest) and abdominal pain. Pertinent negatives include no fever and no dysuria.  Abdominal Pain   Associated symptoms include nausea, vomiting and myalgias. Pertinent negatives include fever, diarrhea, dysuria, frequency and hematuria.    Past Medical History:  Diagnosis Date  . Cervical dysplasia   . Drug dependence ([place]) 07/17/2013   Cocaine, Marijuana use.   Marland Kitchen Dysfunctional uterine bleeding   . Endometritis   . Pyelonephritis   . Trichomoniasis     Patient Active Problem List   Diagnosis Date Noted  . Trichimoniasis 03/17/2016  . Atypical squamous cells cannot exclude high grade squamous intraepithelial lesion on cytologic smear of cervix (ASC-H) 03/17/2016  . Cigarette smoker one half pack a day or less 06/26/2013    Past Surgical History:  Procedure Laterality Date  . DILATION AND CURETTAGE OF UTERUS  2007   "to clean out infection"  . WISDOM TOOTH EXTRACTION       OB History    Gravida  3   Para  3   Term  3   Preterm  0   AB  0   Living  3     SAB  0   TAB  0   Ectopic  0   Multiple  0   Live Births  3            Home Medications    Prior to Admission medications   Not  on File    Family History Family History  Problem Relation Age of Onset  . Cancer Sister        cervical  . Hypertension Maternal Grandmother   . Hypertension Mother   . Hypertension Brother   . Breast cancer Maternal Aunt     Social History Social History   Tobacco Use  . Smoking status: Current Every Day Smoker    Packs/day: 1.00    Years: 26.00    Pack years: 26.00    Types: Cigarettes  . Smokeless tobacco: Never Used  Substance Use Topics  . Alcohol use: Yes    Alcohol/week: 7.0 standard drinks    Types: 7 Standard drinks or equivalent per week    Comment: cocaine use until pos preg test; Marijuana until 01/16/14  . Drug use: Yes    Frequency: 4.0 times per week    Types: Marijuana    Comment: no cocaine x1 year     Allergies   Flagyl [metronidazole]   Review of Systems Review of Systems  Constitutional: Positive for activity change and fatigue. Negative for chills and fever.  HENT: Negative.   Respiratory: Negative.  Negative for cough and shortness of breath.   Cardiovascular: Positive  for chest pain (right lateral chest).  Gastrointestinal: Positive for abdominal pain, nausea and vomiting. Negative for diarrhea.  Genitourinary: Positive for flank pain. Negative for dysuria, frequency, hematuria, vaginal bleeding and vaginal discharge.  Musculoskeletal: Positive for back pain and myalgias. Negative for neck pain.  Skin: Negative.   Neurological: Negative.      Physical Exam Updated Vital Signs BP (!) 131/97 (BP Location: Left Wrist)   Pulse 97   Temp 98.3 F (36.8 C) (Oral)   Resp 18   SpO2 99%   Physical Exam Vitals signs and nursing note reviewed.  Constitutional:      Appearance: She is well-developed.  HENT:     Head: Normocephalic.  Neck:     Musculoskeletal: Normal range of motion and neck supple.  Cardiovascular:     Rate and Rhythm: Normal rate and regular rhythm.     Heart sounds: No murmur.  Pulmonary:     Effort: Pulmonary  effort is normal.     Breath sounds: Normal breath sounds.  Abdominal:     Palpations: Abdomen is soft.     Tenderness: There is abdominal tenderness in the right upper quadrant and right lower quadrant. There is no guarding or rebound.  Genitourinary:    Comments: Right CVA tenderness Musculoskeletal: Normal range of motion.  Skin:    General: Skin is warm and dry.     Findings: No rash.  Neurological:     Mental Status: She is alert.     Cranial Nerves: No cranial nerve deficit.      ED Treatments / Results  Labs (all labs ordered are listed, but only abnormal results are displayed) Labs Reviewed  COMPREHENSIVE METABOLIC PANEL - Abnormal; Notable for the following components:      Result Value   Potassium 3.3 (*)    Calcium 8.8 (*)    All other components within normal limits  CBC - Abnormal; Notable for the following components:   RBC 3.69 (*)    MCV 101.4 (*)    MCH 34.4 (*)    All other components within normal limits  URINALYSIS, ROUTINE W REFLEX MICROSCOPIC - Abnormal; Notable for the following components:   Color, Urine AMBER (*)    APPearance CLOUDY (*)    Hgb urine dipstick MODERATE (*)    Ketones, ur 5 (*)    Protein, ur 30 (*)    Nitrite POSITIVE (*)    Leukocytes, UA SMALL (*)    Bacteria, UA MANY (*)    All other components within normal limits  LIPASE, BLOOD  I-STAT BETA HCG BLOOD, ED ([facility], [facility], [facility] ONLY)    EKG None  Radiology No results found.  Procedures Procedures (including critical care time)  Medications Ordered in ED Medications  cephALEXin (KEFLEX) capsule 1,000 mg (has no administration in time range)  ibuprofen (ADVIL,MOTRIN) tablet 600 mg (has no administration in time range)  ondansetron (ZOFRAN-ODT) disintegrating tablet 4 mg (has no administration in time range)     Initial Impression / Assessment and Plan / ED Course  I have reviewed the triage vital signs and the nursing notes.  Pertinent labs & imaging results that were  available during my care of the patient were reviewed by me and considered in my medical decision making (see chart for details).     Patient to ED with symptoms of right sided pain, "not feeling well", nausea with vomiting x 1. Symptoms x 1 day.  She is afebrile in ED. There is general tenderness  along the right body including lateral chest, flank tenderness and right abdominal tenderness. No guarding.   VSS. No leukocytosis or fever. With diffuse tenderness, doubt intra-abdominal process, specifically doubt appendicitis. She has flank tenderness and a UA that is positive for infection. Suspect pyelonephritis. She denies vaginal symptoms. Doubt PID. She is not pregnant.   Will start on Keflex, provide Zofran for prn nausea control, ibuprofen. Strict return precautions discussed - specifically, high fever, severe pain, urinary bleeding or difficulty, uncontrolled vomiting. She is felt appropriate for discharge home.   Final Clinical Impressions(s) / ED Diagnoses   Final diagnoses:  None   1. Pyelonephritis   ED Discharge Orders    None       Charlann Lange, Hershal Coria 05/25/18 2357    Duffy Bruce, MD 05/26/18 [phone]

## 2018-05-30 ENCOUNTER — Ambulatory Visit: Payer: Medicaid Other

## 2018-06-06 ENCOUNTER — Encounter: Payer: Self-pay | Admitting: *Deleted

## 2018-06-14 ENCOUNTER — Ambulatory Visit (INDEPENDENT_AMBULATORY_CARE_PROVIDER_SITE_OTHER): Payer: Medicaid Other | Admitting: Family Medicine

## 2018-06-14 ENCOUNTER — Encounter: Payer: Self-pay | Admitting: Family Medicine

## 2018-06-14 ENCOUNTER — Other Ambulatory Visit (HOSPITAL_COMMUNITY)
Admission: RE | Admit: 2018-06-14 | Discharge: 2018-06-14 | Disposition: A | Discharge status: Home / Self Care | Payer: Medicaid Other | Source: Ambulatory Visit | Attending: Family Medicine | Admitting: Family Medicine

## 2018-06-14 VITALS — BP 112/71 | HR 76 | Ht 63.5 in | Wt 131.9 lb

## 2018-06-14 DIAGNOSIS — R87611 Atypical squamous cells cannot exclude high grade squamous intraepithelial lesion on cytologic smear of cervix (ASC-H): Secondary | ICD-10-CM

## 2018-06-14 DIAGNOSIS — N871 Moderate cervical dysplasia: Secondary | ICD-10-CM | POA: Diagnosis not present

## 2018-06-15 NOTE — Patient Instructions (Signed)
Colposcopy, Care After  This sheet gives you information about how to care for yourself after your procedure. Your doctor may also give you more specific instructions. If you have problems or questions, contact your doctor.  What can I expect after the procedure?  If you did not have a tissue sample removed (did not have a biopsy), you may only have some spotting for a few days. You can go back to your normal activities.  If you had a tissue sample removed, it is common to have:   Soreness and pain. This may last for a few days.   Light-headedness.   Mild bleeding from your vagina or dark-colored, grainy discharge from your vagina. This may last for a few days. You may need to wear a sanitary pad.   Spotting for at least 48 hours after the procedure.  Follow these instructions at home:     Take over-the-counter and prescription medicines only as told by your doctor. Ask your doctor what medicines you can start taking again. This is very important if you take blood-thinning medicine.   Do not drive or use heavy machinery while taking prescription pain medicine.   For 3 days, or as long as your doctor tells you, avoid:  ? Douching.  ? Using tampons.  ? Having sex.   If you use birth control (contraception), keep using it.   Limit activity for the first day after the procedure. Ask your doctor what activities are safe for you.   It is up to you to get the results of your procedure. Ask your doctor when your results will be ready.   Keep all follow-up visits as told by your doctor. This is important.  Contact a doctor if:   You get a skin rash.  Get help right away if:   You are bleeding a lot from your vagina. It is a lot of bleeding if you are using more than one pad an hour for 2 hours in a row.   You have clumps of blood (blood clots) coming from your vagina.   You have a fever.   You have chills   You have pain in your lower belly (pelvic area).   You have signs of infection, such as vaginal  discharge that is:  ? Different than usual.  ? Yellow.  ? Bad-smelling.   You have very pain or cramps in your lower belly that do not get better with medicine.   You feel light-headed.   You feel dizzy.   You pass out (faint).  Summary   If you did not have a tissue sample removed (did not have a biopsy), you may only have some spotting for a few days. You can go back to your normal activities.   If you had a tissue sample removed, it is common to have mild pain and spotting for 48 hours.   For 3 days, or as long as your doctor tells you, avoid douching, using tampons and having sex.   Get help right away if you have bleeding, very bad pain, or signs of infection.  This information is not intended to replace advice given to you by your health care provider. Make sure you discuss any questions you have with your health care provider.  Document Released: 11/03/2007 Document Revised: 02/04/2016 Document Reviewed: 02/04/2016  Elsevier Interactive Patient Education  2019 [employer]

## 2018-06-15 NOTE — Progress Notes (Signed)
    GYNECOLOGY CLINIC COLPOSCOPY PROCEDURE NOTE  43 y.o. [id number] here for colposcopy for ASCUS with POSITIVE high risk HPV pap smear on 04/10/2018. Discussed role for HPV in cervical dysplasia, need for surveillance.  Patient given informed consent, signed copy in the chart, time out was performed.  Placed in lithotomy position. Cervix viewed with speculum and colposcope after application of acetic acid.   Colposcopy adequate? Yes  acetowhite lesion(s) noted at [facility]   biopsies obtained at 5 and 8 o'clock.  ECC specimen obtained. All specimens were labeled and sent to pathology.   Patient was given post procedure instructions.  Will follow up pathology and manage accordingly; patient will be contacted with results and recommendations.  Routine preventative health maintenance measures emphasized.   Cindy Hernandez 06/14/2018 14:25

## 2018-06-20 ENCOUNTER — Telehealth: Payer: Self-pay | Admitting: *Deleted

## 2018-06-20 NOTE — Telephone Encounter (Signed)
Called pt to inform her of colposcopy results.  Explained that due to this result, she will need a procedure called a LEEP.  Briefly explained the LEEP procedure and pt verbalized understanding.  Informed pt that the front office would be in contact with her to schedule the appointment for the LEEP.  Pt verbalized understanding.

## 2018-06-20 NOTE — Telephone Encounter (Signed)
-----   Message from Donnamae Jude, MD sent at 06/16/2018  2:03 PM EST ----- CIN2, book for leep  1. Endocervix, curettage - SCANT BENIGN ENDOCERVIX AND ABUNDANT MUCUS. 2. Cervix, biopsy, 5 & 10 o'clock - HIGH GRADE SQUAMOUS INTRAEPITHELIAL LESION, CIN-II (MODERATE DYSPLASIA).

## 2018-06-22 ENCOUNTER — Ambulatory Visit: Payer: Medicaid Other

## 2018-06-26 ENCOUNTER — Ambulatory Visit: Payer: Medicaid Other

## 2018-06-27 ENCOUNTER — Ambulatory Visit: Payer: Medicaid Other

## 2018-07-04 LAB — SPECIMEN STATUS REPORT

## 2018-07-04 LAB — T4, FREE: Free T4: 0.88 ng/dL (ref 0.82–1.77)

## 2018-07-04 LAB — T3, FREE: T3, Free: 3 pg/mL (ref 2.0–4.4)

## 2018-07-08 ENCOUNTER — Emergency Department (HOSPITAL_COMMUNITY)
Admission: EM | Admit: 2018-07-08 | Discharge: 2018-07-08 | Disposition: A | Discharge status: Home / Self Care | Payer: Medicaid Other | Attending: Emergency Medicine | Admitting: Emergency Medicine

## 2018-07-08 ENCOUNTER — Encounter (HOSPITAL_COMMUNITY): Payer: Self-pay

## 2018-07-08 ENCOUNTER — Emergency Department (HOSPITAL_COMMUNITY): Payer: Medicaid Other

## 2018-07-08 DIAGNOSIS — J101 Influenza due to other identified influenza virus with other respiratory manifestations: Secondary | ICD-10-CM | POA: Insufficient documentation

## 2018-07-08 DIAGNOSIS — F17210 Nicotine dependence, cigarettes, uncomplicated: Secondary | ICD-10-CM | POA: Diagnosis not present

## 2018-07-08 DIAGNOSIS — R509 Fever, unspecified: Secondary | ICD-10-CM | POA: Diagnosis present

## 2018-07-08 DIAGNOSIS — R05 Cough: Secondary | ICD-10-CM | POA: Diagnosis not present

## 2018-07-08 LAB — INFLUENZA PANEL BY PCR (TYPE A & B)
INFLBPCR: NEGATIVE
Influenza A By PCR: POSITIVE — AB

## 2018-07-08 LAB — URINALYSIS, ROUTINE W REFLEX MICROSCOPIC
Bilirubin Urine: NEGATIVE
Glucose, UA: NEGATIVE mg/dL
Ketones, ur: 80 mg/dL — AB
Nitrite: NEGATIVE
PH: 5 (ref 5.0–8.0)
Protein, ur: 30 mg/dL — AB
Specific Gravity, Urine: 1.025 (ref 1.005–1.030)

## 2018-07-08 LAB — I-STAT BETA HCG BLOOD, ED (MC, WL, AP ONLY): I-stat hCG, quantitative: <5 m[IU]/mL (ref <5)

## 2018-07-08 MED ORDER — KETOROLAC TROMETHAMINE 30 MG/ML IJ SOLN
30.0000 mg | Freq: Once | INTRAMUSCULAR | Status: AC
  Administered 2018-07-08: 30 mg via INTRAMUSCULAR
  Filled 2018-07-08: qty 1

## 2018-07-08 MED ORDER — IBUPROFEN 600 MG PO TABS: 600.0000 mg | ORAL_TABLET | Freq: Four times a day (QID) | ORAL | 0 refills | Status: DC | PRN

## 2018-07-08 NOTE — ED Notes (Signed)
Patient verbalizes understanding of discharge instructions. Opportunity for questioning and answers were provided. Armband removed by staff, pt discharged from ED.  

## 2018-07-08 NOTE — Discharge Instructions (Signed)
Alternate tylenol and ibuprofen as needed for fever and pain

## 2018-07-08 NOTE — ED Triage Notes (Signed)
Pt presents with c/o generalized body aches, cough, and sore throat since yesterday. Pt states her son has had similar symptoms. Pt endorses chills at home, but does not know if she has had a fever, Afebrile on arrival. Pt states she has been taking ibuprofren for pain with some relief.

## 2018-07-08 NOTE — ED Provider Notes (Signed)
[facility]ERGENCY DEPARTMENT Provider Note   CSN: [id number] Arrival date & time: 07/08/18  1239     History   Chief Complaint Chief Complaint  Patient presents with  . Generalized Body Aches  . Sore Throat  . Cough    HPI Cindy Hernandez is a 43 y.o. female.  Pt presents to the ED today with fever, chills, and body aches.  Sx started suddenly yesterday.  She took ibuprofen early this morning for her pain.  She said her son has the same sx.     Past Medical History:  Diagnosis Date  . Cervical dysplasia   . Drug dependence ([place]) 07/17/2013   Cocaine, Marijuana use.   Marland Kitchen Dysfunctional uterine bleeding   . Endometritis   . Pyelonephritis   . Trichomoniasis     Patient Active Problem List   Diagnosis Date Noted  . Trichimoniasis 03/17/2016  . Atypical squamous cells cannot exclude high grade squamous intraepithelial lesion on cytologic smear of cervix (ASC-H) 03/17/2016  . Cigarette smoker one half pack a day or less 06/26/2013    Past Surgical History:  Procedure Laterality Date  . DILATION AND CURETTAGE OF UTERUS  2007   "to clean out infection"  . WISDOM TOOTH EXTRACTION       OB History    Gravida  3   Para  3   Term  3   Preterm  0   AB  0   Living  3     SAB  0   TAB  0   Ectopic  0   Multiple  0   Live Births  3            Home Medications    Prior to Admission medications   Medication Sig Start Date End Date Taking? Authorizing Provider  ibuprofen (ADVIL,MOTRIN) 600 MG tablet Take 1 tablet (600 mg total) by mouth every 6 (six) hours as needed. 07/08/18   Isla Pence, MD    Family History Family History  Problem Relation Age of Onset  . Cancer Sister        cervical  . Hypertension Maternal Grandmother   . Hypertension Mother   . Hypertension Brother   . Breast cancer Maternal Aunt     Social History Social History   Tobacco Use  . Smoking status: Current Every Day Smoker    Packs/day:  1.00    Years: 26.00    Pack years: 26.00    Types: Cigarettes  . Smokeless tobacco: Never Used  Substance Use Topics  . Alcohol use: Yes    Alcohol/week: 7.0 standard drinks    Types: 7 Standard drinks or equivalent per week    Comment: cocaine use until pos preg test; Marijuana until 01/16/14  . Drug use: Yes    Frequency: 4.0 times per week    Types: Marijuana    Comment: no cocaine x1 year     Allergies   Flagyl [metronidazole]   Review of Systems Review of Systems  HENT: Positive for congestion.   Respiratory: Positive for cough.   Musculoskeletal: Positive for myalgias.  All other systems reviewed and are negative.    Physical Exam Updated Vital Signs BP 106/74   Pulse 94   Temp 98.1 F (36.7 C) (Oral)   Resp 16   Ht 5' 3.5" (1.613 m)   Wt 59 kg   LMP 06/12/2018   SpO2 98%   BMI 22.67 kg/m   Physical Exam Vitals  signs and nursing note reviewed.  Constitutional:      Appearance: She is well-developed.  HENT:     Head: Normocephalic and atraumatic.     Right Ear: Tympanic membrane and ear canal normal.     Left Ear: Tympanic membrane and ear canal normal.     Mouth/Throat:     Mouth: Mucous membranes are moist.  Eyes:     Conjunctiva/sclera: Conjunctivae normal.     Pupils: Pupils are equal, round, and reactive to light.  Neck:     Musculoskeletal: Normal range of motion.  Cardiovascular:     Rate and Rhythm: Normal rate and regular rhythm.     Heart sounds: Normal heart sounds.  Pulmonary:     Effort: Pulmonary effort is normal.     Breath sounds: Normal breath sounds.  Abdominal:     General: Bowel sounds are normal.     Palpations: Abdomen is soft.  Skin:    General: Skin is warm.     Capillary Refill: Capillary refill takes less than 2 seconds.  Neurological:     General: No focal deficit present.     Mental Status: She is alert and oriented to person, place, and time.  Psychiatric:        Mood and Affect: Mood normal.         Behavior: Behavior normal.      ED Treatments / Results  Labs (all labs ordered are listed, but only abnormal results are displayed) Labs Reviewed  URINALYSIS, ROUTINE W REFLEX MICROSCOPIC - Abnormal; Notable for the following components:      Result Value   APPearance CLOUDY (*)    Hgb urine dipstick MODERATE (*)    Ketones, ur 80 (*)    Protein, ur 30 (*)    Leukocytes, UA LARGE (*)    Bacteria, UA RARE (*)    All other components within normal limits  INFLUENZA PANEL BY PCR (TYPE A & B) - Abnormal; Notable for the following components:   Influenza A By PCR POSITIVE (*)    All other components within normal limits  I-STAT BETA HCG BLOOD, ED ([facility], [facility], [facility] ONLY)    EKG None  Radiology Dg Chest 2 View  Result Date: 07/08/2018 CLINICAL DATA:  Cough and sore throat EXAM: CHEST - 2 VIEW COMPARISON:  April 25, 2006 FINDINGS: The lungs are clear. The heart size and pulmonary vascularity are normal. No adenopathy. No bone lesions. IMPRESSION: No edema or consolidation. Electronically Signed   By: Lowella Grip III M.D.   On: 07/08/2018 13:24    Procedures Procedures (including critical care time)  Medications Ordered in ED Medications  ketorolac (TORADOL) 30 MG/ML injection 30 mg (30 mg Intramuscular Given 07/08/18 1410)     Initial Impression / Assessment and Plan / ED Course  I have reviewed the triage vital signs and the nursing notes.  Pertinent labs & imaging results that were available during my care of the patient were reviewed by me and considered in my medical decision making (see chart for details).    Pt is positive for the flu.  She does not meet high risk criteria for treatment with tamiflu.  She is encouraged to drink lots of fluids and to take tylenol/ibuprofen for pain.  Return if worse.  Final Clinical Impressions(s) / ED Diagnoses   Final diagnoses:  Influenza A    ED Discharge Orders         Ordered    ibuprofen (ADVIL,MOTRIN) 600 MG  tablet   Every 6 hours PRN     07/08/18 1519           Isla Pence, MD 07/08/18 1520

## 2018-07-17 ENCOUNTER — Ambulatory Visit: Payer: Medicaid Other

## 2018-07-20 ENCOUNTER — Ambulatory Visit: Payer: Medicaid Other | Admitting: Obstetrics & Gynecology

## 2018-07-21 ENCOUNTER — Ambulatory Visit: Payer: Medicaid Other

## 2018-07-25 ENCOUNTER — Ambulatory Visit: Payer: Medicaid Other

## 2018-08-07 ENCOUNTER — Encounter: Payer: Self-pay | Admitting: Family Medicine

## 2018-08-07 ENCOUNTER — Ambulatory Visit (INDEPENDENT_AMBULATORY_CARE_PROVIDER_SITE_OTHER): Payer: Medicaid Other | Admitting: Family Medicine

## 2018-08-07 VITALS — BP 113/82 | HR 77 | Wt 130.8 lb

## 2018-08-07 DIAGNOSIS — Z3042 Encounter for surveillance of injectable contraceptive: Secondary | ICD-10-CM | POA: Diagnosis not present

## 2018-08-07 DIAGNOSIS — N871 Moderate cervical dysplasia: Secondary | ICD-10-CM

## 2018-08-07 LAB — POCT PREGNANCY, URINE: PREG TEST UR: NEGATIVE

## 2018-08-07 MED ORDER — MEDROXYPROGESTERONE ACETATE 150 MG/ML IM SUSP
150.0000 mg | Freq: Once | INTRAMUSCULAR | Status: AC
  Administered 2018-08-07: 150 mg via INTRAMUSCULAR

## 2018-08-07 NOTE — Patient Instructions (Addendum)
Cryoablation, Care After  This sheet gives you information about how to care for yourself after your procedure. Your health care provider may also give you more specific instructions. If you have problems or questions, contact your health care provider.  What can I expect after the procedure?  After the procedure, it is common to have:   Soreness around the treatment area.   Mild pain and swelling in the treatment area.  Follow these instructions at home:  Treatment area care     Follow instructions from your health care provider about how to take care of your incision. Make sure you:  ? Wash your hands with soap and water before you change your bandage (dressing). If soap and water are not available, use hand sanitizer.  ? Change your dressing as told by your health care provider.  ? Leave stitches (sutures) in place. They may need to stay in place for 2 weeks or longer.   Check your treatment area every day for signs of infection. Check for:  ? More redness, swelling, or pain.  ? More fluid or blood.  ? Warmth.  ? Pus or a bad smell.   Keep the treated area clean, dry, and covered with a dressing until it has healed. Clean the area with soap and water or as told by your health care provider.   You may shower if your health care provider approves. If your bandage gets wet, change it right away.  Activity   Follow instructions from your health care provider about any activity limitations.   Do not drive for 24 hours if you received a medicine to help you relax (sedative).  General instructions   Take over-the-counter and prescription medicines only as told by your health care provider.   Keep all follow-up visits as told by your health care provider. This is important.  Contact a health care provider if:   You do not have a bowel movement for 2 days.   You have nausea or vomiting.   You have more redness, swelling, or pain around your treatment area.   You have more fluid or blood coming from your  treatment area.   Your treatment area feels warm to the touch.   You have pus or a bad smell coming from your treatment area.   You have a fever.  Get help right away if:   You have severe pain.   You have trouble swallowing or breathing.   You have severe weakness or dizziness.   You have chest pain or shortness of breath.  This information is not intended to replace advice given to you by your health care provider. Make sure you discuss any questions you have with your health care provider.  Document Released: 03/07/2013 Document Revised: 12/05/2015 Document Reviewed: 10/15/2015  Elsevier Interactive Patient Education  2019 Elsevier Inc.

## 2018-08-07 NOTE — Assessment & Plan Note (Signed)
S/p Cryo

## 2018-08-07 NOTE — Progress Notes (Signed)
.     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 She was counseled about that efficacy of this procedure, and possible need for excisional procedure in the future if her cervical dysplasia persists.  The risks of the procedure where explained in detail and patient was told to expect a copious amount of discharge in the next few weeks. All her questions were answered, and written informed consent was obtained.  The patient was placed in the dorsal lithotomy position and a vaginal speculum was placed. Her cervix was visualized and patient was noted to have had normal size transformation zone. The appropriate cryotherapy probe was picked and affixed to cryotherapy apparatus. Then nitrogen gas was then activated, the probe was coated with lubricating jelly and applied to the transformation zone of the cervix. This was kept in place for 3 minutes. The cryotherapy was then stopped and all instruments were removed from the patient's pelvis; a thawing period of 5 minutes was observed.  A second cycle of cryotherapy was then administered to the cervix for 3 minutes.  The patient tolerated the procedure well without any complications. Routine post procedure instructions were given to the patient.  Will repeat pap smear in 6 months and manage accordingly.  Additionally, patient is having bleeding x 3 wks. She is late for her Depo by 1 month. Suspect this is related to this. To resume her Depo.  Return in about 3 months (around 11/07/2018) for Depo Provera.  Cindy Hernandez 08/07/2018 5:04 PM

## 2018-08-17 ENCOUNTER — Inpatient Hospital Stay: Admission: RE | Admit: 2018-08-17 | Payer: Medicaid Other | Source: Ambulatory Visit

## 2018-08-25 ENCOUNTER — Encounter: Payer: Self-pay | Admitting: *Deleted

## 2018-10-24 ENCOUNTER — Ambulatory Visit: Payer: Medicaid Other

## 2018-10-27 ENCOUNTER — Ambulatory Visit: Payer: Medicaid Other

## 2018-11-16 ENCOUNTER — Ambulatory Visit: Payer: Medicaid Other

## 2019-01-01 ENCOUNTER — Ambulatory Visit (INDEPENDENT_AMBULATORY_CARE_PROVIDER_SITE_OTHER): Payer: Medicaid Other | Admitting: Emergency Medicine

## 2019-01-01 ENCOUNTER — Other Ambulatory Visit (HOSPITAL_COMMUNITY)
Admission: RE | Admit: 2019-01-01 | Discharge: 2019-01-01 | Disposition: A | Discharge status: Home / Self Care | Payer: Medicaid Other | Source: Ambulatory Visit | Attending: Family Medicine | Admitting: Family Medicine

## 2019-01-01 ENCOUNTER — Other Ambulatory Visit: Payer: Self-pay

## 2019-01-01 DIAGNOSIS — N898 Other specified noninflammatory disorders of vagina: Secondary | ICD-10-CM

## 2019-01-01 DIAGNOSIS — R300 Dysuria: Secondary | ICD-10-CM | POA: Diagnosis not present

## 2019-01-01 DIAGNOSIS — Z3042 Encounter for surveillance of injectable contraceptive: Secondary | ICD-10-CM

## 2019-01-01 LAB — POCT URINALYSIS DIP (DEVICE)
Bilirubin Urine: NEGATIVE
Glucose, UA: NEGATIVE mg/dL
Ketones, ur: NEGATIVE mg/dL
Nitrite: NEGATIVE
Protein, ur: NEGATIVE mg/dL
Specific Gravity, Urine: 1.025 (ref 1.005–1.030)
Urobilinogen, UA: 0.2 mg/dL (ref 0.0–1.0)
pH: 6 (ref 5.0–8.0)

## 2019-01-01 MED ORDER — MEDROXYPROGESTERONE ACETATE 150 MG/ML IM SUSP
150.0000 mg | Freq: Once | INTRAMUSCULAR | Status: AC
  Administered 2019-01-01: 150 mg via INTRAMUSCULAR

## 2019-01-01 NOTE — Progress Notes (Signed)
Brynlynn D Chura here for Depo-Provera  Injection.  Injection administered without complication. Patient will return in 3 months for next injection.  Pt complains of yellow vaginal discharge that "smells like corn chips" and itchy. Pt also states that she has burning and itching when urinating. Pt was informed that a urine culture would be sent and all her results from today would take a couple of days to come back and she would be informed about abnormal results. Pt verbalized understanding and had no further questions.

## 2019-01-01 NOTE — Progress Notes (Signed)
Patient seen and assessed by nursing staff during this encounter. I have reviewed the chart and agree with the documentation and plan.  Marcille Buffy DNP, CNM  01/01/19  4:56 PM

## 2019-01-03 LAB — URINE CULTURE

## 2019-01-04 LAB — POCT PREGNANCY, URINE: Preg Test, Ur: NEGATIVE

## 2019-01-05 ENCOUNTER — Other Ambulatory Visit: Payer: Self-pay | Admitting: Advanced Practice Midwife

## 2019-01-05 LAB — CERVICOVAGINAL ANCILLARY ONLY
Candida vaginitis: NEGATIVE
Chlamydia: NEGATIVE
Neisseria Gonorrhea: NEGATIVE
Trichomonas: POSITIVE — AB

## 2019-01-05 MED ORDER — SULFAMETHOXAZOLE-TRIMETHOPRIM 800-160 MG PO TABS: 1.0000 | ORAL_TABLET | Freq: Two times a day (BID) | ORAL | 0 refills | Status: DC

## 2019-01-05 NOTE — Progress Notes (Signed)
Urine culture + Klebsiella aerogenes. Sensitive to Bactrim. RX sent in to patient's pharmacy on file.   Marcille Buffy DNP, CNM  01/05/19  1:39 PM

## 2019-01-07 ENCOUNTER — Other Ambulatory Visit: Payer: Self-pay | Admitting: Advanced Practice Midwife

## 2019-01-07 MED ORDER — METRONIDAZOLE 500 MG PO TABS: 2000.0000 mg | ORAL_TABLET | Freq: Once | ORAL | 0 refills | Status: AC

## 2019-01-07 MED ORDER — ONDANSETRON 8 MG PO TBDP: 8.0000 mg | ORAL_TABLET | Freq: Three times a day (TID) | ORAL | 0 refills | Status: DC | PRN

## 2019-01-08 ENCOUNTER — Telehealth (INDEPENDENT_AMBULATORY_CARE_PROVIDER_SITE_OTHER): Payer: Medicaid Other | Admitting: Lactation Services

## 2019-01-08 ENCOUNTER — Telehealth: Payer: Self-pay | Admitting: Lactation Services

## 2019-01-08 DIAGNOSIS — A599 Trichomoniasis, unspecified: Secondary | ICD-10-CM

## 2019-01-08 DIAGNOSIS — B9689 Other specified bacterial agents as the cause of diseases classified elsewhere: Secondary | ICD-10-CM

## 2019-01-08 DIAGNOSIS — N760 Acute vaginitis: Secondary | ICD-10-CM

## 2019-01-08 MED ORDER — METRONIDAZOLE 500 MG PO TABS: 2000.0000 mg | ORAL_TABLET | Freq: Once | ORAL | 0 refills | Status: AC

## 2019-01-08 NOTE — Telephone Encounter (Signed)
-----   Message from Tresea Mall, CNM sent at 01/05/2019  1:36 PM EDT ----- Patient has UTI. I will send in rx. She does not have mychart. Please call her.

## 2019-01-08 NOTE — Telephone Encounter (Signed)
Called pt to inform her that she tested positive for BV, Trich and UTI.   Pt noted to have a allergy to Flagyl as characterized by N&V, Zofran was also called in for the patient to take at the same time. Spoke with Dr. Kennon Rounds and she reports that there is no other meds that will treat Trich.   Informed pt that partner needs to also be treated 2nd dose sent to Pharmacy for Partner treatment.   Attempted to call pt back to let her know that per NNP they have called her in Zofran to help with N&V when taking Flagyl. Pt did not answer. Asked pt to call the office at her convenience.

## 2019-01-08 NOTE — Telephone Encounter (Signed)
Pt returned call from earlier. She was confused about her medication. Informed her she needs to pick up her Flagyl for her and her partner, Zofran for N&V and antibiotics for UTI and take all as prescribed.   Discussed abstaining from sexual intercourse for 7 days after both partners are treated to prevent reinfection. Pt voiced understanding.

## 2019-03-19 ENCOUNTER — Ambulatory Visit (INDEPENDENT_AMBULATORY_CARE_PROVIDER_SITE_OTHER): Payer: Medicaid Other | Admitting: *Deleted

## 2019-03-19 ENCOUNTER — Other Ambulatory Visit: Payer: Self-pay

## 2019-03-19 DIAGNOSIS — Z3042 Encounter for surveillance of injectable contraceptive: Secondary | ICD-10-CM

## 2019-03-19 MED ORDER — MEDROXYPROGESTERONE ACETATE 150 MG/ML IM SUSP
150.0000 mg | Freq: Once | INTRAMUSCULAR | Status: AC
  Administered 2019-03-19: 150 mg via INTRAMUSCULAR

## 2019-03-19 NOTE — Progress Notes (Signed)
Pt here for Depo injection. Has her 42yo son with her so injection given to pt in car. Depo given without difficulty and pt tol well. Will make appt to repeat in 35months.

## 2019-03-19 NOTE — Progress Notes (Signed)
Patient seen and assessed by nursing staff during this encounter. I have reviewed the chart and agree with the documentation and plan.  Verita Schneiders, MD 03/19/2019 3:19 PM

## 2019-04-16 ENCOUNTER — Ambulatory Visit (INDEPENDENT_AMBULATORY_CARE_PROVIDER_SITE_OTHER): Payer: Medicaid Other | Admitting: Nurse Practitioner

## 2019-04-16 ENCOUNTER — Other Ambulatory Visit (HOSPITAL_COMMUNITY)
Admission: RE | Admit: 2019-04-16 | Discharge: 2019-04-16 | Disposition: A | Discharge status: Home / Self Care | Payer: Medicaid Other | Source: Ambulatory Visit | Attending: Nurse Practitioner | Admitting: Nurse Practitioner

## 2019-04-16 ENCOUNTER — Other Ambulatory Visit: Payer: Self-pay

## 2019-04-16 ENCOUNTER — Encounter: Payer: Self-pay | Admitting: Nurse Practitioner

## 2019-04-16 VITALS — BP 110/75 | HR 89 | Wt 128.0 lb

## 2019-04-16 DIAGNOSIS — R87629 Unspecified abnormal cytological findings in specimens from vagina: Secondary | ICD-10-CM | POA: Insufficient documentation

## 2019-04-16 DIAGNOSIS — F17210 Nicotine dependence, cigarettes, uncomplicated: Secondary | ICD-10-CM | POA: Diagnosis not present

## 2019-04-16 DIAGNOSIS — A599 Trichomoniasis, unspecified: Secondary | ICD-10-CM

## 2019-04-16 NOTE — Patient Instructions (Signed)
[phone]

## 2019-04-16 NOTE — Progress Notes (Signed)
   GYNECOLOGY OFFICE VISIT NOTE   History:  43 y.o. [id number] here today for repeat pap smear following last colposcopy.  Of note, she has had trichomonas several times on pap and again on vaginal swab in August 2020.  Given single dose of metronidazole and she and her partner were treated in August.   Reports no intercourse since then.  But has periodic spotting noted when she is drinking.  Does not drink daily.  Last spotting was 2 days ago. She denies any abnormal vaginal discharge or other concerns.   Past Medical History:  Diagnosis Date  . Cervical dysplasia   . Drug dependence ([place]) 07/17/2013   Cocaine, Marijuana use.   Marland Kitchen Dysfunctional uterine bleeding   . Endometritis   . Pyelonephritis   . Trichomoniasis     Past Surgical History:  Procedure Laterality Date  . DILATION AND CURETTAGE OF UTERUS  2007   "to clean out infection"  . WISDOM TOOTH EXTRACTION      The following portions of the patient's history were reviewed and updated as appropriate: allergies, current medications, past family history, past medical history, past social history, past surgical history and problem list.   Health Maintenance:  ASCUS pap on 03-2018.  Had cryo on 08-07-18. Mammogram ordered.   Review of Systems:  Pertinent items noted in HPI and remainder of comprehensive ROS otherwise negative.  Objective:  Physical Exam BP 110/75   Pulse 89   Wt 128 lb (58.1 kg)   BMI 22.32 kg/m  CONSTITUTIONAL: Well-developed, well-nourished female in no acute distress.  HENT:  Normocephalic, atraumatic. External right and left ear normal.  EYES: Conjunctivae and EOM are normal. Pupils are equal, round.  No scleral icterus.  SKIN: Skin is warm and dry. No rash noted. Not diaphoretic. No erythema. No pallor. NEUROLOGIC: Alert and oriented to person, place, and time. Normal muscle tone coordination. No cranial nerve deficit noted. PSYCHIATRIC: Normal mood and affect. Normal behavior. Normal judgment and thought  content. ABDOMEN: Soft, no distention noted.   PELVIC: normal appearing vulva and vagina.  No polps seen, no blood in vagina. no vaginal discharge MUSCULOSKELETAL: Normal range of motion. No edema noted.  Labs and Imaging No results found.  Assessment & Plan:  1. Abnormal vaginal Pap smear - Had ASCUS and cryo done in March 2020.  - Cytology - PAP( [place]) - Cervicovaginal ancillary only( [place])  2.  Trichomonas Last few pap smears and one vaginal swab in August were positive for trich. Discussed with client new infections vs. Resistant trich.  Reports she and her partner were treated with single dose of medication with 4 pills in August.  Has not had intercourse since then.  Does have periodic vaginal bleeding.  None seen today.  Will watch lab results.  Client does not have MyChart.  Routine preventative health maintenance measures emphasized. Please refer to After Visit Summary for other counseling recommendations.   Return in about 7 weeks (around 06/04/2019) for alreday has appt scheduled in Jan for Depo shot.   Total face-to-face time with patient: 15 minutes.  Over 50% of encounter was spent on counseling and coordination of care.  Earlie Server, RN, MSN, NP-BC Nurse Practitioner, [facility] for [employer], [place] [facility] 04/16/2019 1:58 PM

## 2019-04-17 LAB — CERVICOVAGINAL ANCILLARY ONLY
Bacterial Vaginitis (gardnerella): POSITIVE — AB
Candida Glabrata: NEGATIVE
Candida Vaginitis: POSITIVE — AB
Chlamydia: NEGATIVE
Comment: NEGATIVE
Comment: NEGATIVE
Comment: NEGATIVE
Comment: NEGATIVE
Comment: NEGATIVE
Comment: NORMAL
Neisseria Gonorrhea: NEGATIVE
Trichomonas: POSITIVE — AB

## 2019-04-17 MED ORDER — METRONIDAZOLE 500 MG PO TABS: 500.0000 mg | ORAL_TABLET | Freq: Two times a day (BID) | ORAL | 0 refills | Status: DC

## 2019-04-17 MED ORDER — FLUCONAZOLE 150 MG PO TABS: ORAL_TABLET | ORAL | 0 refills | Status: DC

## 2019-04-17 NOTE — Addendum Note (Signed)
Addended by: Virginia Rochester on: 04/17/2019 05:20 PM   Modules accepted: Orders

## 2019-04-17 NOTE — Progress Notes (Signed)
Wet prep results - BV, Yeast and continues to have positive DNA trich.  Was treated with a one time dose of Flagyl in August (and partner was treated then also).  Client reports no sex since that treatment.  On review of past several pap smears and vaginal swabs, trichomonas is persistently present despite treatements given. 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  Will prescribe Diflucan for yeast and 7 days of metronidazole for BV and Trich.  Will need to have cleint return one month after treatment for TOC since this has been a persistent problem for her.  Reviewed info on refractory trichomonas in UptoDate and this is step one in addressing this problem. Clinic staff to call client to let her know about these prescriptions.  Earlie Server, RN, MSN, NP-BC Nurse Practitioner, [facility] for [employer], [place] [facility] 04/17/2019 5:20 PM

## 2019-04-18 ENCOUNTER — Telehealth: Payer: Self-pay

## 2019-04-18 LAB — CYTOLOGY - PAP
Comment: NEGATIVE
Comment: NEGATIVE
Diagnosis: NEGATIVE
HPV 16: NEGATIVE
HPV 18 / 45: NEGATIVE
High risk HPV: POSITIVE — AB

## 2019-04-18 NOTE — Telephone Encounter (Signed)
Called pt to advise of test results & to pick up Rxs from Pharmacy.Also went over Pap results. Pt verbalized understanding.

## 2019-04-19 ENCOUNTER — Encounter: Payer: Self-pay | Admitting: Nurse Practitioner

## 2019-04-19 DIAGNOSIS — R87810 Cervical high risk human papillomavirus (HPV) DNA test positive: Secondary | ICD-10-CM | POA: Insufficient documentation

## 2019-06-04 ENCOUNTER — Ambulatory Visit: Payer: Medicaid Other

## 2019-06-11 ENCOUNTER — Ambulatory Visit (INDEPENDENT_AMBULATORY_CARE_PROVIDER_SITE_OTHER): Payer: Medicaid Other

## 2019-06-11 ENCOUNTER — Other Ambulatory Visit: Payer: Self-pay

## 2019-06-11 VITALS — BP 136/85 | HR 89 | Wt 126.4 lb

## 2019-06-11 DIAGNOSIS — Z3042 Encounter for surveillance of injectable contraceptive: Secondary | ICD-10-CM

## 2019-06-11 MED ORDER — MEDROXYPROGESTERONE ACETATE 150 MG/ML IM SUSP
150.0000 mg | Freq: Once | INTRAMUSCULAR | Status: AC
  Administered 2019-06-11: 10:00:00 150 mg via INTRAMUSCULAR

## 2019-06-11 NOTE — Progress Notes (Signed)
Chart reviewed for nurse visit. Agree with plan of care.   Jorje Guild, NP 06/11/2019 10:25 AM

## 2019-06-11 NOTE — Progress Notes (Signed)
Cindy Hernandez here for Depo-Provera  Injection.  Injection administered without complication. Patient will return in 3 months for next injection.  Verdell Carmine, RN 06/11/2019  9:29 AM

## 2019-08-27 ENCOUNTER — Ambulatory Visit (INDEPENDENT_AMBULATORY_CARE_PROVIDER_SITE_OTHER): Payer: Medicaid Other | Admitting: General Practice

## 2019-08-27 ENCOUNTER — Ambulatory Visit: Payer: Medicaid Other

## 2019-08-27 ENCOUNTER — Other Ambulatory Visit: Payer: Self-pay

## 2019-08-27 VITALS — BP 122/78 | Ht 63.0 in | Wt 125.0 lb

## 2019-08-27 DIAGNOSIS — Z3042 Encounter for surveillance of injectable contraceptive: Secondary | ICD-10-CM | POA: Diagnosis not present

## 2019-08-27 MED ORDER — MEDROXYPROGESTERONE ACETATE 150 MG/ML IM SUSP
150.0000 mg | Freq: Once | INTRAMUSCULAR | Status: AC
  Administered 2019-08-27: 16:00:00 150 mg via INTRAMUSCULAR

## 2019-08-27 NOTE — Progress Notes (Addendum)
Cindy Hernandez here for Depo-Provera  Injection.  Injection administered without complication. Patient will return in 3 months for next injection.  Derinda Late, RN 08/27/2019  3:15 PM  Chart reviewed for nurse visit. Agree with plan of care.   Virginia Rochester, NP 08/27/2019 3:46 PM

## 2019-11-12 ENCOUNTER — Ambulatory Visit: Payer: Medicaid Other

## 2020-01-22 ENCOUNTER — Other Ambulatory Visit: Payer: Self-pay

## 2020-01-22 ENCOUNTER — Ambulatory Visit: Payer: Medicaid Other

## 2020-01-22 ENCOUNTER — Other Ambulatory Visit (HOSPITAL_COMMUNITY)
Admission: RE | Admit: 2020-01-22 | Discharge: 2020-01-22 | Disposition: A | Discharge status: Home / Self Care | Payer: Medicaid Other | Source: Ambulatory Visit | Attending: Family Medicine | Admitting: Family Medicine

## 2020-01-22 ENCOUNTER — Ambulatory Visit (INDEPENDENT_AMBULATORY_CARE_PROVIDER_SITE_OTHER): Payer: Medicaid Other | Admitting: General Practice

## 2020-01-22 VITALS — BP 112/73 | HR 73 | Ht 63.0 in | Wt 116.0 lb

## 2020-01-22 DIAGNOSIS — Z3202 Encounter for pregnancy test, result negative: Secondary | ICD-10-CM

## 2020-01-22 DIAGNOSIS — N898 Other specified noninflammatory disorders of vagina: Secondary | ICD-10-CM | POA: Insufficient documentation

## 2020-01-22 DIAGNOSIS — Z3042 Encounter for surveillance of injectable contraceptive: Secondary | ICD-10-CM

## 2020-01-22 LAB — POCT PREGNANCY, URINE: Preg Test, Ur: NEGATIVE

## 2020-01-22 MED ORDER — MEDROXYPROGESTERONE ACETATE 150 MG/ML IM SUSP
150.0000 mg | Freq: Once | INTRAMUSCULAR | Status: AC
  Administered 2020-01-22: 150 mg via INTRAMUSCULAR

## 2020-01-22 NOTE — Progress Notes (Signed)
Cindy Hernandez here for Depo-Provera  Injection.  Injection administered without complication. Patient will return in 3 months for next injection.  Derinda Late, RN 01/22/2020  4:59 PM   While here patient reports vaginal discharge for past 2 months with thick white cottage cheese like discharge as well as some vaginal itching and odor. Per chart review, patient has history of trichomonas. Will have patient collect self swab today. Patient was instructed in self swab & specimen collected. Discussed results will be back in 24-48 hours and we will inform her of any abnormal results. Patient verbalized understanding. Patient does report she does NOT use mychart- will need phone call with results.  Koren Bound RN BSN 01/22/20

## 2020-01-23 LAB — CERVICOVAGINAL ANCILLARY ONLY
Bacterial Vaginitis (gardnerella): POSITIVE — AB
Candida Glabrata: NEGATIVE
Candida Vaginitis: NEGATIVE
Chlamydia: NEGATIVE
Comment: NEGATIVE
Comment: NEGATIVE
Comment: NEGATIVE
Comment: NEGATIVE
Comment: NEGATIVE
Comment: NORMAL
Neisseria Gonorrhea: NEGATIVE
Trichomonas: NEGATIVE

## 2020-01-24 ENCOUNTER — Telehealth: Payer: Self-pay | Admitting: Lactation Services

## 2020-01-24 MED ORDER — METRONIDAZOLE 500 MG PO TABS: 500.0000 mg | ORAL_TABLET | Freq: Two times a day (BID) | ORAL | 0 refills | Status: DC

## 2020-01-24 NOTE — Progress Notes (Signed)
Patient was assessed and managed by nursing staff during this encounter. I have reviewed the chart and agree with the documentation and plan. I have also made any necessary editorial changes.  Clarisa Fling, NP 01/24/2020 8:18 AM

## 2020-01-24 NOTE — Telephone Encounter (Signed)
Called patient to inform her of + BV. Patient did not answer. LM that a prescription has been called into her pharmacy and to call the office if she has any questions or concerns.   My Chart Message sent.

## 2020-01-24 NOTE — Telephone Encounter (Signed)
-----   Message from Clarisa Fling, NP sent at 01/24/2020  8:39 AM EDT ----- Please call pt to let her know that she tested positive for BV and can be treated per protocol. Thank you, Elmyra Ricks

## 2020-01-24 NOTE — Telephone Encounter (Signed)
Patient called and wanted a call back. Attempted to call her on her mobile number and she did not answer. LM for patient to call the office at her convenience.   Attempted to call patient on her home phone #, she did not answer. No answer, not able to leave a message.

## 2020-01-29 ENCOUNTER — Telehealth (INDEPENDENT_AMBULATORY_CARE_PROVIDER_SITE_OTHER): Payer: Medicaid Other | Admitting: Family Medicine

## 2020-01-29 DIAGNOSIS — B9689 Other specified bacterial agents as the cause of diseases classified elsewhere: Secondary | ICD-10-CM

## 2020-01-29 DIAGNOSIS — N760 Acute vaginitis: Secondary | ICD-10-CM

## 2020-01-29 MED ORDER — CLINDAMYCIN HCL 300 MG PO CAPS: 300.0000 mg | ORAL_CAPSULE | Freq: Two times a day (BID) | ORAL | 0 refills | Status: DC

## 2020-01-29 NOTE — Telephone Encounter (Signed)
Returned patients call. Patient reports she gets sick when taking and she has itching when taking Flagyl.   Ordered Clindamycin for patient for BV. Discontinued Flagyl order. Intolerance indicated in patients chart.   Patient voiced understanding.

## 2020-01-29 NOTE — Telephone Encounter (Signed)
Patient called and said she need a different meds she said she can't take Fagyl

## 2020-02-07 ENCOUNTER — Telehealth (INDEPENDENT_AMBULATORY_CARE_PROVIDER_SITE_OTHER): Payer: Medicaid Other | Admitting: Lactation Services

## 2020-02-07 DIAGNOSIS — N939 Abnormal uterine and vaginal bleeding, unspecified: Secondary | ICD-10-CM

## 2020-02-07 NOTE — Telephone Encounter (Signed)
Patient called in in regards to medication for BV vs getting her Depo late.   She reports she took her last Depo 1 month late. She is bleeding like a period. Reviewed this can be normal with resuming Depo and to let us know if the bleeding lasts longer than a normal period. Reviewed it can take a few months to readjust to the Depo again. She reports she usually does not bleed on Depo.   Patient to call back with any further questions or concerns.

## 2020-04-08 ENCOUNTER — Ambulatory Visit: Payer: Medicaid Other | Admitting: General Practice

## 2020-04-08 ENCOUNTER — Other Ambulatory Visit: Payer: Self-pay

## 2020-04-08 ENCOUNTER — Other Ambulatory Visit (HOSPITAL_COMMUNITY)
Admission: RE | Admit: 2020-04-08 | Discharge: 2020-04-08 | Disposition: A | Discharge status: Home / Self Care | Payer: Medicaid Other | Source: Ambulatory Visit | Attending: Obstetrics and Gynecology | Admitting: Obstetrics and Gynecology

## 2020-04-08 ENCOUNTER — Encounter: Payer: Self-pay | Admitting: Obstetrics and Gynecology

## 2020-04-08 ENCOUNTER — Ambulatory Visit (INDEPENDENT_AMBULATORY_CARE_PROVIDER_SITE_OTHER): Payer: Medicaid Other | Admitting: Obstetrics and Gynecology

## 2020-04-08 VITALS — BP 105/71 | HR 85 | Wt 117.3 lb

## 2020-04-08 DIAGNOSIS — N760 Acute vaginitis: Secondary | ICD-10-CM

## 2020-04-08 DIAGNOSIS — B9689 Other specified bacterial agents as the cause of diseases classified elsewhere: Secondary | ICD-10-CM

## 2020-04-08 DIAGNOSIS — N898 Other specified noninflammatory disorders of vagina: Secondary | ICD-10-CM | POA: Insufficient documentation

## 2020-04-08 DIAGNOSIS — Z3042 Encounter for surveillance of injectable contraceptive: Secondary | ICD-10-CM

## 2020-04-08 DIAGNOSIS — Z0000 Encounter for general adult medical examination without abnormal findings: Secondary | ICD-10-CM

## 2020-04-08 DIAGNOSIS — Z01419 Encounter for gynecological examination (general) (routine) without abnormal findings: Secondary | ICD-10-CM

## 2020-04-08 MED ORDER — MEDROXYPROGESTERONE ACETATE 150 MG/ML IM SUSP
150.0000 mg | Freq: Once | INTRAMUSCULAR | Status: AC
  Administered 2020-04-08: 150 mg via INTRAMUSCULAR

## 2020-04-08 NOTE — Progress Notes (Signed)
   History:  [name] is a 44 y.o. [phone] who presents to clinic today for vaginal bleeding and irritation.   Treated for BV a few weeks ago. Reports was treated with clindamycin but did not complete full course. Since then continues to have symptoms of vaginal irritation. No new sexual partners.  Reports vaginal bleeding that started two weeks ago.  Gets depo shots for many years. Has never had abnormal bleeding with it, has had no periods since son was born. Bleeding is like light spotting/discharge but has been present for two weeks.    The following portions of the patient's history were reviewed and updated as appropriate: allergies, current medications, family history, past medical history, social history, past surgical history and problem list.  Review of Systems:  Review of Systems  Constitutional: Negative for chills, fever and malaise/fatigue.  Cardiovascular: Negative for chest pain.  Gastrointestinal: Negative for abdominal pain, nausea and vomiting.  Genitourinary: Negative for dysuria, frequency and urgency.  Skin: Negative for rash.  Neurological: Negative for dizziness and headaches.    Denies itching, vaginal burning. No dysuria or frequency.     Objective:  Physical Exam BP 105/71   Pulse 85   Wt 117 lb 4.8 oz (53.2 kg)   BMI 20.78 kg/m  Physical Exam Vitals and nursing note reviewed. Exam conducted with a chaperone present.  Constitutional:      Appearance: Normal appearance.  Genitourinary:    Comments: Normal multiparous os, scant white discharge and some dark brown discharge. No active bleeding noted. No cervical lesions noted.  Skin:    General: Skin is dry.  Neurological:     Mental Status: She is alert.       Labs and Imaging No results found for this or any previous visit (from the past 24 hour(s)).  No results found.   Assessment & Plan:  1. Healthcare maintenance Patient on phone for most of visit. Has some non gyn issues  she would like to get sorted out, does not have PCP. Referral placed. She is within one year of annual exam so will need to return at another time for annual gyn exam with pap.  - Ambulatory referral to [facility]  2. Vaginal discharge, Vaginal bleeding -suspect breakthrough bleeding from being on depo. Not heavy at this time. Will continue to monitor. Discussed return precautions. Obtains vaginal swab to recheck for BV - Cervicovaginal ancillary only( [place])  3. Encounter for surveillance for injectable contraception -Depo given    Janet Berlin, MD 04/08/2020 2:38 PM  OB Family Medicine Fellow, [place] for [employer], [place]

## 2020-04-08 NOTE — Progress Notes (Signed)
error 

## 2020-04-09 ENCOUNTER — Other Ambulatory Visit: Payer: Self-pay | Admitting: Obstetrics and Gynecology

## 2020-04-09 DIAGNOSIS — N760 Acute vaginitis: Secondary | ICD-10-CM

## 2020-04-09 DIAGNOSIS — B9689 Other specified bacterial agents as the cause of diseases classified elsewhere: Secondary | ICD-10-CM

## 2020-04-09 LAB — CERVICOVAGINAL ANCILLARY ONLY
Bacterial Vaginitis (gardnerella): POSITIVE — AB
Candida Glabrata: NEGATIVE
Candida Vaginitis: NEGATIVE
Chlamydia: NEGATIVE
Comment: NEGATIVE
Comment: NEGATIVE
Comment: NEGATIVE
Comment: NEGATIVE
Comment: NEGATIVE
Comment: NORMAL
Neisseria Gonorrhea: NEGATIVE
Trichomonas: NEGATIVE

## 2020-04-09 MED ORDER — CLINDAMYCIN PHOSPHATE 2 % VA CREA: 1.0000 | TOPICAL_CREAM | Freq: Every day | VAGINAL | 0 refills | Status: AC

## 2020-04-10 ENCOUNTER — Other Ambulatory Visit (INDEPENDENT_AMBULATORY_CARE_PROVIDER_SITE_OTHER): Payer: Medicaid Other | Admitting: Obstetrics and Gynecology

## 2020-04-10 DIAGNOSIS — N760 Acute vaginitis: Secondary | ICD-10-CM

## 2020-04-10 DIAGNOSIS — B9689 Other specified bacterial agents as the cause of diseases classified elsewhere: Secondary | ICD-10-CM

## 2020-04-10 MED ORDER — CLINDAMYCIN HCL 300 MG PO CAPS: 300.0000 mg | ORAL_CAPSULE | Freq: Two times a day (BID) | ORAL | 0 refills | Status: AC

## 2020-04-10 NOTE — Progress Notes (Signed)
Nursing line nurse called for rx to be changed rom vaginal cream to oral medication

## 2020-05-06 ENCOUNTER — Ambulatory Visit: Payer: Medicaid Other | Admitting: Nurse Practitioner

## 2020-05-07 ENCOUNTER — Ambulatory Visit (INDEPENDENT_AMBULATORY_CARE_PROVIDER_SITE_OTHER): Payer: Medicaid Other | Admitting: Primary Care

## 2020-05-07 ENCOUNTER — Encounter (INDEPENDENT_AMBULATORY_CARE_PROVIDER_SITE_OTHER): Payer: Self-pay | Admitting: Primary Care

## 2020-05-07 ENCOUNTER — Other Ambulatory Visit: Payer: Self-pay

## 2020-05-07 VITALS — BP 109/74 | HR 83 | Temp 98.7°F | Resp 16 | Ht 63.5 in | Wt 119.8 lb

## 2020-05-07 DIAGNOSIS — H0520 Unspecified exophthalmos: Secondary | ICD-10-CM

## 2020-05-07 DIAGNOSIS — T7840XA Allergy, unspecified, initial encounter: Secondary | ICD-10-CM

## 2020-05-07 DIAGNOSIS — Z7689 Persons encountering health services in other specified circumstances: Secondary | ICD-10-CM

## 2020-05-07 DIAGNOSIS — Z2821 Immunization not carried out because of patient refusal: Secondary | ICD-10-CM | POA: Insufficient documentation

## 2020-05-07 DIAGNOSIS — F1990 Other psychoactive substance use, unspecified, uncomplicated: Secondary | ICD-10-CM

## 2020-05-07 DIAGNOSIS — Z8639 Personal history of other endocrine, nutritional and metabolic disease: Secondary | ICD-10-CM

## 2020-05-07 DIAGNOSIS — Z13220 Encounter for screening for lipoid disorders: Secondary | ICD-10-CM

## 2020-05-07 MED ORDER — HYDROXYZINE HCL 10 MG PO TABS: 10.0000 mg | ORAL_TABLET | Freq: Three times a day (TID) | ORAL | 1 refills | Status: DC | PRN

## 2020-05-07 NOTE — Progress Notes (Signed)
New Patient Office Visit  Subjective:  Patient ID: Cindy Hernandez, female    DOB: 05-Dec-1975  Age: 44 y.o. MRN: [id number]  CC:  Chief Complaint  Patient presents with  . New Patient (Initial Visit)    HPI [name] presents for establishment of care. Her main concern is heat and hives unable to identify causes but she breaks out into whelps face to toes- not present at this time. Treatment is a warm shower and stand in front of a fan until cool down. This has been intermittent for 2 years.  Past Medical History:  Diagnosis Date  . Cervical dysplasia   . Drug dependence ([place]) 07/17/2013   Cocaine, Marijuana use.   Marland Kitchen Dysfunctional uterine bleeding   . Endometritis   . Pyelonephritis   . Trichomoniasis     Past Surgical History:  Procedure Laterality Date  . DILATION AND CURETTAGE OF UTERUS  2007   "to clean out infection"  . WISDOM TOOTH EXTRACTION      Family History  Problem Relation Age of Onset  . Cancer Sister        cervical  . Hypertension Maternal Grandmother   . Hypertension Mother   . Hypertension Brother   . Breast cancer Maternal Aunt     Social History   Socioeconomic History  . Marital status: Single    Spouse name: Not on file  . Number of children: Not on file  . Years of education: Not on file  . Highest education level: Not on file  Occupational History  . Not on file  Tobacco Use  . Smoking status: Current Every Day Smoker    Packs/day: 1.00    Years: 26.00    Pack years: 26.00    Types: Cigarettes  . Smokeless tobacco: Never Used  Substance and Sexual Activity  . Alcohol use: Yes    Alcohol/week: 7.0 standard drinks    Types: 7 Standard drinks or equivalent per week    Comment: cocaine use until pos preg test; Marijuana until 01/16/14  . Drug use: Yes    Frequency: 4.0 times per week    Types: Marijuana    Comment: no cocaine x1 year  . Sexual activity: Yes    Birth control/protection: None, Injection    Comment: last  unprotected intercourse 9/15.  pt desires tubal sterilization.  Other Topics Concern  . Not on file  Social History Narrative  . Not on file   Social Determinants of Health   Financial Resource Strain:   . Difficulty of Paying Living Expenses: Not on file  Food Insecurity:   . Worried About Charity fundraiser in the Last Year: Not on file  . Ran Out of Food in the Last Year: Not on file  Transportation Needs:   . Lack of Transportation (Medical): Not on file  . Lack of Transportation (Non-Medical): Not on file  Physical Activity:   . Days of Exercise per Week: Not on file  . Minutes of Exercise per Session: Not on file  Stress:   . Feeling of Stress : Not on file  Social Connections:   . Frequency of Communication with Friends and Family: Not on file  . Frequency of Social Gatherings with Friends and Family: Not on file  . Attends Religious Services: Not on file  . Active Member of Clubs or Organizations: Not on file  . Attends Archivist Meetings: Not on file  . Marital Status: Not on file  Intimate Partner Violence:   . Fear of Current or Ex-Partner: Not on file  . Emotionally Abused: Not on file  . Physically Abused: Not on file  . Sexually Abused: Not on file    ROS Review of Systems  Skin:       Itching from head to toes   All other systems reviewed and are negative.   Objective:   Today's Vitals: BP 109/74   Pulse 83   Temp 98.7 F (37.1 C)   Resp 16   Ht 5' 3.5" (1.613 m)   Wt 119 lb 12.8 oz (54.3 kg)   SpO2 100%   BMI 20.89 kg/m   Physical Exam Vitals reviewed.  Constitutional:      Appearance: Normal appearance.  HENT:     Head: Normocephalic.     Right Ear: Tympanic membrane normal.     Left Ear: Tympanic membrane normal.     Nose: Nose normal.  Eyes:     Extraocular Movements: Extraocular movements intact.     Pupils: Pupils are equal, round, and reactive to light.  Cardiovascular:     Rate and Rhythm: Normal rate and regular  rhythm.  Pulmonary:     Effort: Pulmonary effort is normal.     Breath sounds: Normal breath sounds.  Abdominal:     General: Abdomen is flat. Bowel sounds are normal.     Palpations: Abdomen is soft.  Musculoskeletal:        General: Normal range of motion.     Cervical back: Normal range of motion.  Skin:    General: Skin is warm and dry.  Neurological:     Mental Status: She is alert and oriented to person, place, and time.  Psychiatric:        Mood and Affect: Mood normal.        Behavior: Behavior normal.        Thought Content: Thought content normal.        Judgment: Judgment normal.     Assessment & Plan:  Cherese was seen today for new patient (initial visit).  Diagnoses and all orders for this visit:  Encounter to establish care Establish care with new PCP  Influenza vaccine refused  Allergic reaction, initial encounter  heat and hives unable to identify causes but she breaks out into whelps face to toes- not present at this time -     hydrOXYzine (ATARAX/VISTARIL) 10 MG tablet; Take 1 tablet (10 mg total) by mouth 3 (three) times daily as needed.  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 hydrOXYzine (ATARAX/VISTARIL) 10 MG tablet Take 1 tablet (10 mg total) by mouth 3 (three) times daily as needed.  . medroxyPROGESTERone (DEPO-PROVERA) 150 MG/ML injection Inject 150 mg into the muscle every 3 (three) months.   No facility-administered encounter medications on file as of 05/07/2020.    Follow-up: Return if symptoms worsen or fail to improve.   Kerin Perna, NP

## 2020-05-07 NOTE — Patient Instructions (Signed)
Cindy Mire, NP-C will be your  (PCP) she is mastered prepared . She is skilled to diagnosed and treat illness. Also able to answer health concern as well as continuing care of varied medical conditions, not limited by cause, organ system, or diagnosis.

## 2020-05-07 NOTE — Progress Notes (Signed)
Pt is wanting to speak with the provider regarding her skin condition

## 2020-05-09 ENCOUNTER — Other Ambulatory Visit (INDEPENDENT_AMBULATORY_CARE_PROVIDER_SITE_OTHER): Payer: Medicaid Other

## 2020-05-19 ENCOUNTER — Other Ambulatory Visit (INDEPENDENT_AMBULATORY_CARE_PROVIDER_SITE_OTHER): Payer: Medicaid Other

## 2020-05-19 ENCOUNTER — Other Ambulatory Visit: Payer: Self-pay

## 2020-05-19 DIAGNOSIS — Z13220 Encounter for screening for lipoid disorders: Secondary | ICD-10-CM

## 2020-05-19 DIAGNOSIS — Z8639 Personal history of other endocrine, nutritional and metabolic disease: Secondary | ICD-10-CM

## 2020-05-19 DIAGNOSIS — H0520 Unspecified exophthalmos: Secondary | ICD-10-CM

## 2020-05-20 LAB — CMP14+EGFR
ALT: 43 IU/L — ABNORMAL HIGH (ref 0–32)
AST: 26 IU/L (ref 0–40)
Albumin/Globulin Ratio: 1.6 (ref 1.2–2.2)
Albumin: 4.6 g/dL (ref 3.8–4.8)
Alkaline Phosphatase: 75 IU/L (ref 44–121)
BUN/Creatinine Ratio: 14 (ref 9–23)
BUN: 10 mg/dL (ref 6–24)
Bilirubin Total: 0.5 mg/dL (ref 0.0–1.2)
CO2: 21 mmol/L (ref 20–29)
Calcium: 9.3 mg/dL (ref 8.7–10.2)
Chloride: 107 mmol/L — ABNORMAL HIGH (ref 96–106)
Creatinine, Ser: 0.72 mg/dL (ref 0.57–1.00)
GFR calc Af Amer: 119 mL/min/{1.73_m2} (ref >59)
GFR calc non Af Amer: 103 mL/min/{1.73_m2} (ref >59)
Globulin, Total: 2.9 g/dL (ref 1.5–4.5)
Glucose: 95 mg/dL (ref 65–99)
Potassium: 3.9 mmol/L (ref 3.5–5.2)
Sodium: 142 mmol/L (ref 134–144)
Total Protein: 7.5 g/dL (ref 6.0–8.5)

## 2020-05-20 LAB — LIPID PANEL
Chol/HDL Ratio: 3.1 ratio (ref 0.0–4.4)
Cholesterol, Total: 197 mg/dL (ref 100–199)
HDL: 63 mg/dL (ref >39)
LDL Chol Calc (NIH): 118 mg/dL — ABNORMAL HIGH (ref 0–99)
Triglycerides: 91 mg/dL (ref 0–149)
VLDL Cholesterol Cal: 16 mg/dL (ref 5–40)

## 2020-05-20 LAB — TSH+FREE T4
Free T4: 0.95 ng/dL (ref 0.82–1.77)
TSH: 0.354 u[IU]/mL — ABNORMAL LOW (ref 0.450–4.500)

## 2020-05-22 ENCOUNTER — Ambulatory Visit: Payer: Self-pay | Admitting: *Deleted

## 2020-05-22 NOTE — Telephone Encounter (Signed)
Patient calling with complaints of vaginal bleeding that she feels may be related to Hydroxyzine.Patient states that she is experiencing spotting, sometimes with wiping but states that it is not heavy. Patient states she may have to change a pad every 3-4 hours. Patient states she is currently on the Depo shot and is due for another one in February. Patient advised that vaginal bleeding is not a side effect commonly seen with taking Hydroxyzine. Patient states that the Hydroxyzine has helped with the itching but it makes her feel "slow" and has experienced back pain that occurs on the left side and goes to the middle.Patient advised of common side effects of Hydroxyzine. Patient also states that she has experienced a foul smelling urine that is bright yellow in color. Patient denies urinary frequency or urgency, abdominal pain or dizziness. Patient given care advice and advised to seek treatment at urgent care or the ED if symptoms become worse. Patient offered an appointment but no appointment was scheduled at the time of call. Patient also advised to contact gynecologist. Understanding verbalized.   Reason for Disposition . Using Depo-Provera injections  Answer Assessment - Initial Assessment Questions 1. AMOUNT: "Describe the bleeding that you are having."    - SPOTTING: spotting, or pinkish / brownish mucous discharge; does not fill panti-liner or pad    - MILD:  less than 1 pad / hour; less than patient's usual menstrual bleeding   - MODERATE: 1-2 pads / hour; 1 menstrual cup every 6 hours; small-medium blood clots (e.g., pea, grape, small coin)   - SEVERE: soaking 2 or more pads/hour for 2 or more hours; 1 menstrual cup every 2 hours; bleeding not contained by pads or continuous red blood from vagina; large blood clots (e.g., golf ball, large coin)      "Big Spot" , one pad every three to four hours 2. ONSET: "When did the bleeding begin?" "Is it continuing now?"     Yes it was  3. MENSTRUAL  PERIOD: "When was the last normal menstrual period?" "How is this different than your period?"     Patient does not have cycles 4. REGULARITY: "How regular are your periods?"     Patien 5. ABDOMINAL PAIN: "Do you have any pain?" "How bad is the pain?"  (e.g., Scale 1-10; mild, moderate, or severe)   - MILD (1-3): doesn't interfere with normal activities, abdomen soft and not tender to touch    - MODERATE (4-7): interferes with normal activities or awakens from sleep, tender to touch    - SEVERE (8-10): excruciating pain, doubled over, unable to do any normal activities     Denies abdominal pain 6. PREGNANCY: "Could you be pregnant?" "Are you sexually active?" "Did you recently give birth?"     N 65. BREASTFEEDING: "Are you breastfeeding?"     no 8. HORMONES: "Are you taking any hormone medications, prescription or OTC?" (e.g., birth control pills, estrogen)     Has been on the Depot shot, next shot due 1/3-1/4 9. BLOOD THINNERS: "Do you take any blood thinners?" (e.g., Coumadin/warfarin, Pradaxa/dabigatran, aspirin)     *No Answer* 10. CAUSE: "What do you think is causing the bleeding?" (e.g., recent gyn surgery, recent gyn procedure; known bleeding disorder, cervical cancer, polycystic ovarian disease, fibroids)         *No Answer* 11. HEMODYNAMIC STATUS: "Are you weak or feeling lightheaded?" If Yes, ask: "Can you stand and walk normally?"        *No Answer* 12. OTHER SYMPTOMS: "  What other symptoms are you having with the bleeding?" (e.g., passed tissue, vaginal discharge, fever, menstrual-type cramps)       *No Answer*  Protocols used: VAGINAL BLEEDING - ABNORMAL-A-AH

## 2020-06-03 ENCOUNTER — Ambulatory Visit: Payer: Medicaid Other | Admitting: Advanced Practice Midwife

## 2020-06-24 ENCOUNTER — Other Ambulatory Visit (HOSPITAL_COMMUNITY)
Admission: RE | Admit: 2020-06-24 | Discharge: 2020-06-24 | Disposition: A | Discharge status: Home / Self Care | Payer: Medicaid Other | Source: Ambulatory Visit | Attending: Nurse Practitioner | Admitting: Nurse Practitioner

## 2020-06-24 ENCOUNTER — Other Ambulatory Visit: Payer: Self-pay

## 2020-06-24 ENCOUNTER — Encounter: Payer: Self-pay | Admitting: Nurse Practitioner

## 2020-06-24 ENCOUNTER — Ambulatory Visit (INDEPENDENT_AMBULATORY_CARE_PROVIDER_SITE_OTHER): Payer: Medicaid Other | Admitting: Nurse Practitioner

## 2020-06-24 ENCOUNTER — Ambulatory Visit: Payer: Medicaid Other

## 2020-06-24 VITALS — BP 116/82 | HR 73 | Ht 63.0 in | Wt 122.0 lb

## 2020-06-24 DIAGNOSIS — Z1231 Encounter for screening mammogram for malignant neoplasm of breast: Secondary | ICD-10-CM | POA: Diagnosis not present

## 2020-06-24 DIAGNOSIS — Z124 Encounter for screening for malignant neoplasm of cervix: Secondary | ICD-10-CM

## 2020-06-24 DIAGNOSIS — Z3042 Encounter for surveillance of injectable contraceptive: Secondary | ICD-10-CM | POA: Diagnosis not present

## 2020-06-24 MED ORDER — MEDROXYPROGESTERONE ACETATE 150 MG/ML IM SUSP
150.0000 mg | Freq: Once | INTRAMUSCULAR | Status: AC
  Administered 2020-06-24: 150 mg via INTRAMUSCULAR

## 2020-06-24 NOTE — Progress Notes (Signed)
Pt also has provider appt in which she will be seen for an annual visit with her Depo Provera today.    Mel Almond, RN  06/24/20

## 2020-06-24 NOTE — Progress Notes (Signed)
   GYNECOLOGY OFFICE VISIT NOTE   History:  45 y.o. [id number] here today for pap smear and depo shot. Does have bleeding today as her depo is due.  Has some cramping with the bleeding.  Asking questions about BV and advised to stop all douching and washing in the vaginal with soap.  She denies any abnormal vaginal discharge, pelvic pain or other concerns.   Past Medical History:  Diagnosis Date  . Cervical dysplasia   . Drug dependence ([place]) 07/17/2013   Cocaine, Marijuana use.   Marland Kitchen Dysfunctional uterine bleeding   . Endometritis   . Pyelonephritis   . Trichomoniasis     Past Surgical History:  Procedure Laterality Date  . DILATION AND CURETTAGE OF UTERUS  2007   "to clean out infection"  . WISDOM TOOTH EXTRACTION      The following portions of the patient's history were reviewed and updated as appropriate: allergies, current medications, past family history, past medical history, past social history, past surgical history and problem list.   Health Maintenance:  Normal pap and positive HRHPV on 04-16-2019.  Has never had Mammogram  Review of Systems:  Pertinent items noted in HPI and remainder of comprehensive ROS otherwise negative.  Objective:  Physical Exam BP 116/82   Pulse 73   Ht 5\' 3"  (1.6 m)   Wt 122 lb (55.3 kg)   LMP 06/22/2020   BMI 21.61 kg/m  CONSTITUTIONAL: Well-developed, well-nourished female in no acute distress.  HENT:  Normocephalic, atraumatic. External right and left ear normal.  EYES: Conjunctivae and EOM are normal. Pupils are equal, round.  No scleral icterus.  NECK: Normal range of motion, supple, no masses SKIN: Skin is warm and dry. No rash noted. Not diaphoretic. No erythema. No pallor. NEUROLOGIC: Alert and oriented to person, place, and time. Normal muscle tone coordination. No cranial nerve deficit noted. PSYCHIATRIC: Normal mood and affect. Normal behavior. Normal judgment and thought content. CARDIOVASCULAR: Normal heart rate  noted RESPIRATORY: Effort and breath sounds normal, no problems with respiration noted ABDOMEN: Soft, no distention noted.   PELVIC: small amount of dark blood, pap done, no abnormalities noted MUSCULOSKELETAL: Normal range of motion. No edema noted.  Labs and Imaging No results found.  Assessment & Plan:  1. Cervical cancer screening Annual exam done but still needed to wait until Pap was due.  Pap only done today. Has questions regarding why she has BV - advised no douching or soap inside the vagina. Has PCP at [facility] - sometimes calls them for problems. Noted trich on pap in 2019, but no trich noted on vaginal testing in August 2021  - Cytology - PAP( [place])  2. Breast cancer screening by mammogram Scheduled - MM 3D SCREEN BREAST BILATERAL; Future  3. Encounter for surveillance of injectable contraceptive Given today - medroxyPROGESTERone (DEPO-PROVERA) injection 150 mg  Discussed Covid vaccine and encouraged her to get the vaccine.  Client is unsure if she will get the vaccine. Routine preventative health maintenance measures emphasized. Please refer to After Visit Summary for other counseling recommendations.   Return in about 3 months (around 09/22/2020) for Depo shot.   Total face-to-face time with patient: 10 minutes.  Over 50% of encounter was spent on counseling and coordination of care.  Earlie Server, RN, MSN, NP-BC Nurse Practitioner, [facility] for [employer], [place] [facility] 06/24/2020 3:06 PM

## 2020-06-30 LAB — CYTOLOGY - PAP
Comment: NEGATIVE
Diagnosis: NEGATIVE
High risk HPV: NEGATIVE

## 2020-08-07 ENCOUNTER — Ambulatory Visit: Payer: Medicaid Other

## 2020-09-22 ENCOUNTER — Other Ambulatory Visit (HOSPITAL_COMMUNITY)
Admission: RE | Admit: 2020-09-22 | Discharge: 2020-09-22 | Disposition: A | Discharge status: Home / Self Care | Payer: Medicaid Other | Source: Ambulatory Visit | Attending: Family Medicine | Admitting: Family Medicine

## 2020-09-22 ENCOUNTER — Other Ambulatory Visit: Payer: Self-pay

## 2020-09-22 ENCOUNTER — Ambulatory Visit (INDEPENDENT_AMBULATORY_CARE_PROVIDER_SITE_OTHER): Payer: Medicaid Other | Admitting: *Deleted

## 2020-09-22 VITALS — BP 122/82 | HR 88 | Ht 63.5 in | Wt 128.8 lb

## 2020-09-22 DIAGNOSIS — N898 Other specified noninflammatory disorders of vagina: Secondary | ICD-10-CM

## 2020-09-22 DIAGNOSIS — Z3042 Encounter for surveillance of injectable contraceptive: Secondary | ICD-10-CM | POA: Diagnosis not present

## 2020-09-22 MED ORDER — MEDROXYPROGESTERONE ACETATE 150 MG/ML IM SUSP
150.0000 mg | Freq: Once | INTRAMUSCULAR | Status: AC
  Administered 2020-09-22: 150 mg via INTRAMUSCULAR

## 2020-09-22 NOTE — Progress Notes (Signed)
ATTESTATION OF SUPERVISION OF RN: Evaluation and management procedures were performed by the RN under my supervision and collaboration. I have reviewed the nursing note and chart and agree with the management and plan for this patient.  Akshay Spang, CNM  

## 2020-09-22 NOTE — Progress Notes (Signed)
Depo Provera 150 mg IM administered as scheduled. Pt tolerated well. Next injection due 7/11-7/25. Last Pap (normal) and Annual Gyn exam done 06/24/20. Pt advised of need for Annual Gyn exam after 06/24/21. Pt stated that she has been having yellow/white vaginal discharge since last exam on 06/24/20. She feels it may be BV and requests testing for BV, yeast and STI's. She stated that if she does have BV, she will need Rx for Cleocin capsules as she is allergic to flagyl. Self swab obtained and pt was informed she will be notified of results and treatment needed if any via Mychart. Pt was offered appt for [facility] due to slightly elevated GAD 7 score and she declined. She voiced understanding of all infomation given.

## 2020-09-23 LAB — CERVICOVAGINAL ANCILLARY ONLY
Bacterial Vaginitis (gardnerella): NEGATIVE
Candida Glabrata: NEGATIVE
Candida Vaginitis: NEGATIVE
Chlamydia: NEGATIVE
Comment: NEGATIVE
Comment: NEGATIVE
Comment: NEGATIVE
Comment: NEGATIVE
Comment: NEGATIVE
Comment: NORMAL
Neisseria Gonorrhea: NEGATIVE
Trichomonas: NEGATIVE

## 2020-09-29 ENCOUNTER — Other Ambulatory Visit: Payer: Self-pay

## 2020-09-29 ENCOUNTER — Ambulatory Visit
Admission: RE | Admit: 2020-09-29 | Discharge: 2020-09-29 | Disposition: A | Discharge status: Home / Self Care | Payer: Medicaid Other | Source: Ambulatory Visit | Attending: Nurse Practitioner | Admitting: Nurse Practitioner

## 2020-09-29 DIAGNOSIS — Z1231 Encounter for screening mammogram for malignant neoplasm of breast: Secondary | ICD-10-CM

## 2020-10-02 ENCOUNTER — Ambulatory Visit: Payer: Medicaid Other | Admitting: Obstetrics and Gynecology

## 2020-12-08 ENCOUNTER — Other Ambulatory Visit: Payer: Self-pay

## 2020-12-08 ENCOUNTER — Ambulatory Visit (INDEPENDENT_AMBULATORY_CARE_PROVIDER_SITE_OTHER): Payer: Medicaid Other

## 2020-12-08 VITALS — BP 115/82 | HR 66 | Ht 63.5 in | Wt 129.5 lb

## 2020-12-08 DIAGNOSIS — Z3042 Encounter for surveillance of injectable contraceptive: Secondary | ICD-10-CM | POA: Diagnosis not present

## 2020-12-08 MED ORDER — MEDROXYPROGESTERONE ACETATE 150 MG/ML IM SUSP
150.0000 mg | Freq: Once | INTRAMUSCULAR | Status: AC
  Administered 2020-12-08: 150 mg via INTRAMUSCULAR

## 2020-12-08 NOTE — Progress Notes (Signed)
Cindy Hernandez here for Depo-Provera Injection. Injection administered without complication. Patient will return in 3 months for next injection between Sept 26 and Oct 10,2022. Next annual visit due November 2022.   Georgia Lopes, RN 12/08/2020  2:23 PM

## 2020-12-09 NOTE — Progress Notes (Signed)
Chart reviewed for nurse visit. Agree with plan of care.   Luvenia Redden, PA-C 12/09/2020 1:44 PM

## 2021-02-24 ENCOUNTER — Ambulatory Visit (INDEPENDENT_AMBULATORY_CARE_PROVIDER_SITE_OTHER): Payer: Medicaid Other

## 2021-02-24 ENCOUNTER — Other Ambulatory Visit: Payer: Self-pay

## 2021-02-24 VITALS — BP 103/80 | HR 79 | Ht 63.5 in | Wt 133.8 lb

## 2021-02-24 DIAGNOSIS — Z3042 Encounter for surveillance of injectable contraceptive: Secondary | ICD-10-CM

## 2021-02-24 MED ORDER — MEDROXYPROGESTERONE ACETATE 150 MG/ML IM SUSP
150.0000 mg | Freq: Once | INTRAMUSCULAR | Status: AC
  Administered 2021-02-24: 150 mg via INTRAMUSCULAR

## 2021-02-24 NOTE — Progress Notes (Addendum)
Cindy Hernandez here for Depo-Provera Injection. Injection administered without complication. Patient will return in 3 months for next injection between Dec 13 and May 26, 2021.Marland Kitchen Next annual visit due Jan 2023.   Georgia Lopes, RN 02/24/2021  2:32 PM   Chart reviewed for nurse visit. Agree with plan of care.   Virginia Rochester, NP 02/25/2021 11:41 AM

## 2021-03-03 ENCOUNTER — Ambulatory Visit: Payer: Medicaid Other

## 2021-04-17 DIAGNOSIS — H5213 Myopia, bilateral: Secondary | ICD-10-CM | POA: Diagnosis not present

## 2021-05-12 ENCOUNTER — Ambulatory Visit: Payer: Medicaid Other

## 2021-05-20 ENCOUNTER — Ambulatory Visit (INDEPENDENT_AMBULATORY_CARE_PROVIDER_SITE_OTHER): Payer: Medicaid Other

## 2021-05-20 ENCOUNTER — Other Ambulatory Visit: Payer: Self-pay

## 2021-05-20 VITALS — BP 121/86 | HR 76 | Wt 136.7 lb

## 2021-05-20 DIAGNOSIS — Z3042 Encounter for surveillance of injectable contraceptive: Secondary | ICD-10-CM | POA: Diagnosis not present

## 2021-05-20 MED ORDER — MEDROXYPROGESTERONE ACETATE 150 MG/ML IM SUSP
150.0000 mg | Freq: Once | INTRAMUSCULAR | Status: AC
  Administered 2021-05-20: 14:00:00 150 mg via INTRAMUSCULAR

## 2021-05-20 NOTE — Progress Notes (Addendum)
Cindy Hernandez here for Depo-Provera Injection. Injection administered without complication. Patient will return in 3 months for next injection between 08/05/21 and 08/19/21. Next annual visit due January 2023. Patient would like to schedule annual visit at time of next Depo injection.   Annabell Howells, RN 05/20/2021  2:03 PM   Chart reviewed for nurse visit. Agree with plan of care.   Virginia Rochester, NP 05/20/2021 5:22 PM

## 2021-05-21 DIAGNOSIS — H524 Presbyopia: Secondary | ICD-10-CM | POA: Diagnosis not present

## 2021-05-21 DIAGNOSIS — H52223 Regular astigmatism, bilateral: Secondary | ICD-10-CM | POA: Diagnosis not present

## 2021-05-31 HISTORY — PX: CERVICAL BIOPSY: SHX590

## 2021-08-05 ENCOUNTER — Encounter: Payer: Self-pay | Admitting: Family Medicine

## 2021-08-05 ENCOUNTER — Ambulatory Visit (INDEPENDENT_AMBULATORY_CARE_PROVIDER_SITE_OTHER): Payer: Medicaid Other | Admitting: Family Medicine

## 2021-08-05 ENCOUNTER — Other Ambulatory Visit: Payer: Self-pay

## 2021-08-05 ENCOUNTER — Other Ambulatory Visit (HOSPITAL_COMMUNITY)
Admission: RE | Admit: 2021-08-05 | Discharge: 2021-08-05 | Disposition: A | Discharge status: Home / Self Care | Payer: Medicaid Other | Source: Ambulatory Visit | Attending: Family Medicine | Admitting: Family Medicine

## 2021-08-05 VITALS — BP 131/83 | HR 96 | Wt 141.0 lb

## 2021-08-05 DIAGNOSIS — Z1231 Encounter for screening mammogram for malignant neoplasm of breast: Secondary | ICD-10-CM | POA: Diagnosis not present

## 2021-08-05 DIAGNOSIS — Z01419 Encounter for gynecological examination (general) (routine) without abnormal findings: Secondary | ICD-10-CM | POA: Diagnosis not present

## 2021-08-05 DIAGNOSIS — Z113 Encounter for screening for infections with a predominantly sexual mode of transmission: Secondary | ICD-10-CM | POA: Insufficient documentation

## 2021-08-05 DIAGNOSIS — Z124 Encounter for screening for malignant neoplasm of cervix: Secondary | ICD-10-CM | POA: Insufficient documentation

## 2021-08-05 DIAGNOSIS — Z021 Encounter for pre-employment examination: Secondary | ICD-10-CM

## 2021-08-05 DIAGNOSIS — Z3042 Encounter for surveillance of injectable contraceptive: Secondary | ICD-10-CM | POA: Diagnosis not present

## 2021-08-05 DIAGNOSIS — Z1211 Encounter for screening for malignant neoplasm of colon: Secondary | ICD-10-CM | POA: Diagnosis not present

## 2021-08-05 DIAGNOSIS — T7840XA Allergy, unspecified, initial encounter: Secondary | ICD-10-CM

## 2021-08-05 MED ORDER — HYDROXYZINE HCL 10 MG PO TABS: 10.0000 mg | ORAL_TABLET | Freq: Three times a day (TID) | ORAL | 1 refills | Status: DC | PRN

## 2021-08-05 MED ORDER — MEDROXYPROGESTERONE ACETATE 150 MG/ML IM SUSP
150.0000 mg | Freq: Once | INTRAMUSCULAR | Status: AC
  Administered 2021-08-05: 150 mg via INTRAMUSCULAR

## 2021-08-05 NOTE — Progress Notes (Addendum)
? ?GYNECOLOGY OFFICE VISIT NOTE ? ?History:  ? Cindy Hernandez is a 46 y.o. [phone] here today for annual gyn visit. ? ?Needs UDS for work ?Went yesterday to employer coordinated visit but accidentally flushed the toilet ?Employer said routine test with [place] would be fine ? ?Having episodes where she breaks out in a sweat  ?Gets very itchy all over ?Last about 5-10 minutes ?Vistaril very effective ?Some women in family went through menopause in 28's ?Has not had period since she started on depo ? ?There are no preventive care reminders to display for this patient. ? ?Past Medical History:  ?Diagnosis Date  ? Cervical dysplasia   ? Drug dependence ([place]) 07/17/2013  ? Cocaine, Marijuana use.   ? Dysfunctional uterine bleeding   ? Endometritis   ? Pyelonephritis   ? Trichomoniasis   ? ? ?Past Surgical History:  ?Procedure Laterality Date  ? DILATION AND CURETTAGE OF UTERUS  2007  ? "to clean out infection"  ? WISDOM TOOTH EXTRACTION    ? ? ?The following portions of the patient's history were reviewed and updated as appropriate: allergies, current medications, past family history, past medical history, past social history, past surgical history and problem list.  ? ?Health Maintenance:   ?Last pap: ?Lab Results  ?Component Value Date  ? DIAGPAP  06/24/2020  ?  - Negative for intraepithelial lesion or malignancy (NILM)  ? 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 [place] Negative 06/24/2020  ? ?Repeat today ? ?Last mammogram:  ?BIRADS 1 09/2020  ? ? ?Review of Systems:  ?Pertinent items noted in HPI and remainder of comprehensive ROS otherwise negative. ? ?Physical Exam:  ?BP 131/83   Pulse 96   Wt 141 lb (64 kg)   BMI 24.59 kg/m?  ?CONSTITUTIONAL: Well-developed, well-nourished female in no acute distress.  ?HEENT:  Normocephalic, atraumatic. External right and left ear normal. No scleral icterus.  ?NECK: Normal range of motion, supple, no masses noted on observation ?SKIN: No rash noted. Not diaphoretic. No erythema. No  pallor. ?MUSCULOSKELETAL: Normal range of motion. No edema noted. ?NEUROLOGIC: Alert and oriented to person, place, and time. Normal muscle tone coordination.  ?PSYCHIATRIC: Normal mood and affect. Normal behavior. Normal judgment and thought content. ?RESPIRATORY: Effort normal, no problems with respiration noted ?PELVIC: Normal appearing external genitalia; normal appearing vaginal mucosa and cervix.  No abnormal discharge noted.   ? ?Labs and Imaging ?No results found for this or any previous visit (from the past 168 hour(s)). ?No results found.    ?Assessment and Plan:  ? ?Problem List Items Addressed This Visit   ? ?  ? Other  ? Well woman exam - Primary  ?  Cancer screening: ?- Pap: collected today per [facility] ?- Mammogram: BIRADS 1 in 09/2020, repeat ordered ?- Colonoscopy: due in August of this year, ordered ? ?Contraception: ?Depo given at visit ? ?Mood: ?Flowsheet Row Clinical Support from 05/20/2021 in Center for [employer] at [facility]  ?PHQ-9 Total Score 2  ? ?  ? ?Vaccinations: ?- Flu: declined ?- HPV: n/a ?- COVID: declined ? ?Metabolic: ?- DM: W5I today ?- Lipids: screened in 2021, mild elevations not meeting treatment threshold, re-screen next year ? ?ID: ?- STI: accepts swabs and serologies ? ?Menopause: ?May be having some symptoms though not totally consistent, ctm ? ?Also obtained UDS at her request, understands may not be sufficient for employer but she would have to pay another $49 for repeat testing and she is hoping to  avoid this.  ?  ?  ? ?Other Visit Diagnoses   ? ? Breast cancer screening by mammogram      ? Relevant Orders  ? MM Digital Screening  ? Screening for cervical cancer      ? Relevant Orders  ? Cytology - PAP( [place])  ? Screening for colon cancer      ? Relevant Orders  ? Ambulatory referral to Gastroenterology  ? Encounter for surveillance of injectable contraceptive      ? Relevant Medications  ? medroxyPROGESTERone (DEPO-PROVERA) injection  150 mg (Completed)  ? Screening examination for sexually transmitted disease      ? Relevant Orders  ? Cytology - PAP( [place])  ? RPR+HBsAg+HCVAb+...  ? Pre-employment drug testing      ? Relevant Orders  ? ToxASSURE Select 13 (MW), Urine  ? Allergic reaction, initial encounter      ? Relevant Medications  ? hydrOXYzine (ATARAX) 10 MG tablet  ? ?  ? ? ?Routine preventative health maintenance measures emphasized. ?Please refer to After Visit Summary for other counseling recommendations.  ? ?Return in about 1 year (around 08/06/2022) for Annual Wellness Visit.   ? ?Total face-to-face time with patient: 25 minutes.  Over 50% of encounter was spent on counseling and coordination of care. ? ? ?Clarnce Flock, MD/MPH ?Attending Family Medicine Physician, Faculty Practice ?Center for [place] ? ? ?Addendum ?While getting blood draw patient reported intense pain and paresthesia in her R arm. I came in to evaluate a few minutes later, blood drawn in R AC fossa, strength intact in RUE and sensation intact to sharp touch throughout using broken q-tip stick. Reassured patient that likely nerve was brushed during blood draw but should return to normal in about a day.  ? ?Clarnce Flock, MD/MPH ?Attending Family Medicine Physician, Faculty Practice ?Center for [place] ? ?9:58 AM ?08/05/21 ? ?

## 2021-08-05 NOTE — Assessment & Plan Note (Addendum)
Cancer screening: ?- Pap: collected today per [facility] ?- Mammogram: BIRADS 1 in 09/2020, repeat ordered ?- Colonoscopy: due in August of this year, ordered ? ?Contraception: ?Depo given at visit ? ?Mood: ?Flowsheet Row Clinical Support from 05/20/2021 in [facility] [employer] at [facility]  ?PHQ-9 Total Score 2  ? ?  ? ? ?Vaccinations: ?- Flu: declined ?- HPV: n/a ?- COVID: declined ? ?Metabolic: ?- DM: Q9I today ?- Lipids: screened in 2021, mild elevations not meeting treatment threshold, re-screen next year ? ?ID: ?- STI: accepts swabs and serologies ? ?Menopause: ?May be having some symptoms though not totally consistent, ctm ? ?Also obtained UDS at her request, understands may not be sufficient for employer but she would have to pay another $49 for repeat testing and she is hoping to avoid this.  ?

## 2021-08-06 LAB — RPR+HBSAG+HCVAB+...
HIV Screen 4th Generation wRfx: NONREACTIVE
Hep C Virus Ab: NONREACTIVE
Hepatitis B Surface Ag: NEGATIVE
RPR Ser Ql: NONREACTIVE

## 2021-08-08 LAB — TOXASSURE SELECT 13 (MW), URINE

## 2021-08-10 ENCOUNTER — Encounter: Payer: Self-pay | Admitting: Neurology

## 2021-08-10 ENCOUNTER — Telehealth: Payer: Self-pay | Admitting: Family Medicine

## 2021-08-10 DIAGNOSIS — M79601 Pain in right arm: Secondary | ICD-10-CM

## 2021-08-10 NOTE — Telephone Encounter (Signed)
Called patient to discuss unexpected UDS result of +cocaine metabolites. She is unsure why this would be. No recent trip to dentist, saw eye doctor in January but that would not explain this.  ? ?Also reports she is still having numbness, tingling, and pain in her R arm after a blood draw at last clinic visit. Discussed it has still been a relatively short amount of time and may need more time to heal, but if she would like to see a specialist I recommend Neurology as they are experts on nerves and nerve pain. She is in agreement with this plan, referral placed.  ?

## 2021-08-11 ENCOUNTER — Telehealth: Payer: Self-pay | Admitting: Family Medicine

## 2021-08-11 LAB — CYTOLOGY - PAP
Chlamydia: NEGATIVE
Comment: NEGATIVE
Comment: NEGATIVE
Comment: NEGATIVE
Comment: NORMAL
Diagnosis: UNDETERMINED — AB
HPV 16: NEGATIVE
HPV 18 / 45: NEGATIVE
High risk HPV: POSITIVE — AB
Neisseria Gonorrhea: NEGATIVE

## 2021-08-11 NOTE — Telephone Encounter (Signed)
Patient called in stating she will not be doing another colposcopy procedure and to cancel the one she had scheduled. I explained to the patient that the doctor wanted this procedure done again and she refused the appointment.  ?

## 2021-08-11 NOTE — Progress Notes (Signed)
Abnormal pap. Testing history is somewhat difficult to decipher exactly where she falls on the algorithm. She had colpo w CIN 2 in 05/2018, was supposed to have LEEP but ended up getting repeat pap in 04/2019 that was NILM and +HRHPV, neg HPV 16/18/45 and was recommended for repeat cotesting in one year. She then had a pap in 05/2020 that was NILM with neg HPV. ?Today's pap result is now ASCUS, +HRHPV, neg 16/18/45. Entering patient into algorithm gives result of "Use Clinical Judgement". ? ?Given history I favor repeat colposcopy at this time. ? ?Admin staff please call patient and schedule colposcopy

## 2021-08-18 ENCOUNTER — Telehealth: Payer: Self-pay | Admitting: Family Medicine

## 2021-08-18 MED ORDER — DIAZEPAM 5 MG PO TABS: 5.0000 mg | ORAL_TABLET | Freq: Once | ORAL | 0 refills | Status: DC | PRN

## 2021-08-18 NOTE — Telephone Encounter (Signed)
Called patient as she cancelled her colposcopy appointment. I confirmed identity with full name and DOB.  ? ?After much discussion patient reported she did not want to do it because she had had colposcopy in the past and it was painful. She admitted that she has a family hx of cervical cancer and is very worried about it. I discussed with her that I am happy to work with her to make the procedure more comfortable, either with valium or oxycodone (but not both) to make sure we could get it done in the office and not have to take her to the OR for a simple procedure. Eventually patient agreed to this, valium x1 sent to her pharmacy.  ? ?Admin staff please call patient and schedule colposcopy for next available with me and remind patient that if she takes the valium prior to coming then she will need to have a ride to and from the clinic. ?

## 2021-09-15 ENCOUNTER — Ambulatory Visit: Payer: Medicaid Other | Admitting: Family Medicine

## 2021-09-30 ENCOUNTER — Ambulatory Visit: Payer: Medicaid Other

## 2021-10-02 ENCOUNTER — Ambulatory Visit: Payer: Medicaid Other

## 2021-10-07 ENCOUNTER — Other Ambulatory Visit (HOSPITAL_COMMUNITY)
Admission: RE | Admit: 2021-10-07 | Discharge: 2021-10-07 | Disposition: A | Discharge status: Home / Self Care | Payer: Medicaid Other | Source: Ambulatory Visit | Attending: Family Medicine | Admitting: Family Medicine

## 2021-10-07 ENCOUNTER — Ambulatory Visit (INDEPENDENT_AMBULATORY_CARE_PROVIDER_SITE_OTHER): Payer: Medicaid Other | Admitting: Family Medicine

## 2021-10-07 ENCOUNTER — Encounter: Payer: Self-pay | Admitting: Family Medicine

## 2021-10-07 VITALS — BP 139/88 | HR 96 | Wt 143.1 lb

## 2021-10-07 DIAGNOSIS — N871 Moderate cervical dysplasia: Secondary | ICD-10-CM | POA: Diagnosis not present

## 2021-10-07 MED ORDER — IBUPROFEN 800 MG PO TABS
800.0000 mg | ORAL_TABLET | Freq: Once | ORAL | Status: AC
  Administered 2021-10-07: 800 mg via ORAL

## 2021-10-07 NOTE — Progress Notes (Signed)
? ? ?  GYNECOLOGY CLINIC COLPOSCOPY PROCEDURE NOTE ? ?46 y.o. [id number] here for colposcopy for pap finding of: ? ?Lab Results  ?Component Value Date  ? DIAGPAP (A) 08/05/2021  ?  - Atypical squamous cells of undetermined significance (ASC-US)  ? 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 [place] Positive (A) 08/05/2021  ? ? ?Discussed role for HPV in cervical dysplasia, need for surveillance, nature of the procedure, and risks and benefits. ? ?See result note from last pap for detailed discussion of her unusual testing history.  ? ?Pregnancy test: ?Lab Results  ?Component Value Date  ? PREGTESTUR NEGATIVE 01/22/2020  ? ? ?Allergies  ?Allergen Reactions  ? Flagyl [Metronidazole] Itching and Nausea And Vomiting  ? ? ?Patient given informed consent, signed copy in the chart, time out was performed.   ? ?Placed in lithotomy position. Cervix viewed with speculum and colposcope after application of acetic acid.  ? ?Colposcopy Adequacy ?Cervix fully visualized: Yes ? ?SCJ fully visualized: Yes ? ?Colposcopy Findings ?Acetowhite changes from 10-2 o clock with lesion within a lesion as well as from 4-7 o clock. ? ?Corresponding biopsies were obtained.   ? ?ECC specimen was not obtained. ? ?All specimens were labeled and sent to pathology. ? ?Hemostatic measures: Pressure and Monsel's solution ? ?Complications: none ? ?Patient tolerated the procedure well. ? ?OBGyn Exam ? ?Colposcopy Impressions ?Low grade features ? ?Plan ?Treatment plan pending biopsy results, per patient preference they will be communicated by telephone. ? ?Patient was given post procedure instructions.  Will follow up pathology and manage accordingly; patient will be contacted with results and recommendations.  Routine preventative health maintenance measures emphasized. ? ?Clarnce Flock, MD/MPH ?Attending Family Medicine Physician, Faculty Practice ?[facility] [place] ? ?

## 2021-10-07 NOTE — Addendum Note (Signed)
Addended by: Brita Romp E on: 10/07/2021 04:02 PM ? ? Modules accepted: Orders ? ?

## 2021-10-07 NOTE — Addendum Note (Signed)
Addended by: Clayton Lefort on: 10/07/2021 04:17 PM ? ? Modules accepted: Orders ? ?

## 2021-10-09 LAB — SURGICAL PATHOLOGY

## 2021-10-12 ENCOUNTER — Telehealth: Payer: Self-pay

## 2021-10-12 ENCOUNTER — Ambulatory Visit: Payer: Medicaid Other

## 2021-10-12 NOTE — Telephone Encounter (Addendum)
-----   Message from Clarnce Flock, MD sent at 10/12/2021 10:41 AM EDT ----- ?CIN-I colpo, repeat pap in 1 year ?Patient is very anxious about the result, please give her a call and let her know the result is good ? ?Notified pt results and follow up.  Pt verbalized understanding with no further questions.  ? ?Roderick Calo,RN  ?10/12/21 ?

## 2021-10-27 ENCOUNTER — Ambulatory Visit
Admission: RE | Admit: 2021-10-27 | Discharge: 2021-10-27 | Disposition: A | Discharge status: Home / Self Care | Payer: Medicaid Other | Source: Ambulatory Visit | Attending: Family Medicine | Admitting: Family Medicine

## 2021-10-27 DIAGNOSIS — Z1231 Encounter for screening mammogram for malignant neoplasm of breast: Secondary | ICD-10-CM | POA: Diagnosis not present

## 2022-01-18 ENCOUNTER — Ambulatory Visit: Payer: Medicaid Other | Admitting: Neurology

## 2022-01-18 ENCOUNTER — Encounter: Payer: Self-pay | Admitting: Neurology

## 2022-01-18 DIAGNOSIS — Z029 Encounter for administrative examinations, unspecified: Secondary | ICD-10-CM

## 2022-03-11 ENCOUNTER — Encounter: Payer: Self-pay | Admitting: Internal Medicine

## 2022-03-25 ENCOUNTER — Ambulatory Visit (AMBULATORY_SURGERY_CENTER): Payer: Self-pay

## 2022-03-25 VITALS — Ht 63.0 in | Wt 138.0 lb

## 2022-03-25 DIAGNOSIS — Z1211 Encounter for screening for malignant neoplasm of colon: Secondary | ICD-10-CM

## 2022-03-25 MED ORDER — PEG 3350-KCL-NA BICARB-NACL 420 G PO SOLR: 4000.0000 mL | Freq: Once | ORAL | 0 refills | Status: AC

## 2022-03-25 NOTE — Progress Notes (Signed)
No egg or soy allergy known to patient  No issues known to pt with past sedation with any surgeries or procedures Patient denies ever being told they had issues or difficulty with intubation  No FH of Malignant Hyperthermia Pt is not on diet pills Pt is not on  home 02  Pt is not on blood thinners  Pt denies issues with constipation  No A fib or A flutter Have any cardiac testing pending--no Pt instructed to use Singlecare.com or GoodRx for a price reduction on prep  Patient's chart reviewed by Cindy Hernandez CNRA prior to previsit and patient appropriate for the [facility].  Previsit completed and red dot placed by patient's name on their procedure day (on provider's schedule).    

## 2022-04-20 ENCOUNTER — Encounter: Payer: Self-pay | Admitting: Internal Medicine

## 2022-04-20 ENCOUNTER — Ambulatory Visit: Payer: Medicaid Other | Admitting: Internal Medicine

## 2022-04-20 VITALS — BP 139/90 | HR 83 | Temp 97.3°F | Ht 63.5 in | Wt 138.0 lb

## 2022-04-20 MED ORDER — SODIUM CHLORIDE 0.9 % IV SOLN: 500.0000 mL | INTRAVENOUS | Status: DC

## 2022-04-20 MED ORDER — NA SULFATE-K SULFATE-MG SULF 17.5-3.13-1.6 GM/177ML PO SOLN: 1.0000 | Freq: Once | ORAL | 0 refills | Status: AC

## 2022-04-20 NOTE — Progress Notes (Unsigned)
Pt stated that she did cocaine and marijuana yesterday.  Per Dr Lorenso Courier and C. Powers, CRNA we will not be able to do procedure today.  Informed pt and rescheduled her for 06/03/22 @ 330pm.  Gave pt new prep instructions.  She requested a different prep so Suprep was given.  Also instructed pt to refrain from using any drugs for 1 week prior to procedure.

## 2022-04-20 NOTE — Progress Notes (Signed)
Vital signs checked by:DT  The patient states no changes in medical or surgical history since pre-visit screening on 03/25/22.

## 2022-06-02 ENCOUNTER — Telehealth: Payer: Self-pay | Admitting: Internal Medicine

## 2022-06-02 NOTE — Telephone Encounter (Signed)
Good afternoon Dr. Lorenso Courier,   Patient called stating that she needed to reschedule her colonoscopy with you for tomorrow at 3:30 due to not having the prep.  Patient was rescheduled for 2/19 at 3:30

## 2022-06-02 NOTE — Telephone Encounter (Signed)
Inbound call from patient stating she needed to reschedule her procedure that was scheduled for 1/4. Patient was rescheduled for 2/19 at 3:230 and is requesting new prep instructions for her new procedure date. Please advise.

## 2022-06-02 NOTE — Telephone Encounter (Signed)
Spoke with pt; she doesn't need new prep sent to pharmacy.  New instructions sent via [facility]

## 2022-06-03 ENCOUNTER — Encounter: Payer: Medicaid Other | Admitting: Internal Medicine

## 2022-07-13 ENCOUNTER — Encounter: Payer: Self-pay | Admitting: Certified Registered Nurse Anesthetist

## 2022-07-19 ENCOUNTER — Encounter: Payer: Self-pay | Admitting: Internal Medicine

## 2022-07-19 ENCOUNTER — Ambulatory Visit: Payer: Medicaid Other | Admitting: Internal Medicine

## 2022-07-19 VITALS — BP 133/99 | HR 84 | Temp 98.0°F | Ht 63.5 in | Wt 138.0 lb

## 2022-07-19 MED ORDER — SODIUM CHLORIDE 0.9 % IV SOLN: 500.0000 mL | Freq: Once | INTRAVENOUS | Status: DC

## 2022-07-19 NOTE — Progress Notes (Unsigned)
Patient states that she wishes to reschedule for this Wed at 73 AM. She said her last cocaine use was this past Friday. She did eat tacos last night.

## 2022-07-19 NOTE — Progress Notes (Signed)
Patient reports for a colonoscopy with Dr. Lorenso Courier this afternoon. During admitting process, it is discovered that patient ate yesterday including a taco wrap at 1800 last evening, also states she completed all of the prep last and only completed a "sip" of the prep today due to nausea, no vomiting. Patient states the last time she went to the bathroom her stool was cloudy yellow with flecks of stool. Notified CRNA who has also spoken to MD. Cindy Hernandez patient to do procedure today with strong likelihood that she is not cleaned out and will have to repeat procedure or reschedule her this week with her following instructions. Patient opts to reschedule procedure to tomorrow at 1000 with Dr. Rush Landmark. Patient is agreeable to this. Further prep instructions given to her by Cindy Crimes, RN.

## 2022-07-19 NOTE — Progress Notes (Unsigned)
Case canceled by patient and rescheduled to Wednesday.

## 2022-07-19 NOTE — Progress Notes (Signed)
Pt's states no medical or surgical changes since previsit or office visit. 

## 2022-07-20 ENCOUNTER — Ambulatory Visit (AMBULATORY_SURGERY_CENTER): Payer: Medicaid Other | Admitting: Gastroenterology

## 2022-07-20 ENCOUNTER — Encounter: Payer: Self-pay | Admitting: Gastroenterology

## 2022-07-20 VITALS — BP 116/74 | HR 68 | Temp 98.4°F | Resp 13 | Ht 63.0 in | Wt 138.0 lb

## 2022-07-20 DIAGNOSIS — Z1211 Encounter for screening for malignant neoplasm of colon: Secondary | ICD-10-CM | POA: Diagnosis not present

## 2022-07-20 DIAGNOSIS — K642 Third degree hemorrhoids: Secondary | ICD-10-CM | POA: Diagnosis not present

## 2022-07-20 DIAGNOSIS — D128 Benign neoplasm of rectum: Secondary | ICD-10-CM

## 2022-07-20 DIAGNOSIS — K644 Residual hemorrhoidal skin tags: Secondary | ICD-10-CM | POA: Diagnosis not present

## 2022-07-20 HISTORY — PX: COLONOSCOPY: SHX174

## 2022-07-20 MED ORDER — SODIUM CHLORIDE 0.9 % IV SOLN: 500.0000 mL | Freq: Once | INTRAVENOUS | Status: DC

## 2022-07-20 NOTE — Progress Notes (Signed)
To pacu, VSS. Report to Rn,tb

## 2022-07-20 NOTE — Progress Notes (Signed)
Pt's states no medical or surgical changes since previsit or office visit. 

## 2022-07-20 NOTE — Op Note (Signed)
[facility] Patient Name: Cindy Hernandez Procedure Date: 07/20/2022 9:37 AM MRN: [id number] Endoscopist: Justice Britain , MD, [id number] Age: 47 Referring MD:  Date of Birth: [birth year] Gender: Female Account #: [id number] Procedure:                Colonoscopy Indications:              Screening for colorectal malignant neoplasm, This                            is the patient's first colonoscopy Medicines:                Monitored Anesthesia Care Procedure:                Pre-Anesthesia Assessment:                           - Prior to the procedure, a History and Physical                            was performed, and patient medications and                            allergies were reviewed. The patient's tolerance of                            previous anesthesia was also reviewed. The risks                            and benefits of the procedure and the sedation                            options and risks were discussed with the patient.                            All questions were answered, and informed consent                            was obtained. Prior Anticoagulants: The patient has                            taken no anticoagulant or antiplatelet agents. ASA                            Grade Assessment: II - A patient with mild systemic                            disease. After reviewing the risks and benefits,                            the patient was deemed in satisfactory condition to                            undergo the procedure.  After obtaining informed consent, the colonoscope                            was passed under direct vision. Throughout the                            procedure, the patient's blood pressure, pulse, and                            oxygen saturations were monitored continuously. The                            CF [id number] was introduced through the anus                            and advanced to  the 3 cm into the ileum. The                            colonoscopy was performed without difficulty. The                            patient tolerated the procedure. The quality of the                            bowel preparation was good. The terminal ileum,                            ileocecal valve, appendiceal orifice, and rectum                            were photographed. Scope In: 10:03:21 AM Scope Out: 10:21:58 AM Scope Withdrawal Time: 0 hours 13 minutes 0 seconds  Total Procedure Duration: 0 hours 18 minutes 37 seconds  Findings:                 The digital rectal exam findings include                            hemorrhoids. Pertinent negatives include no                            palpable rectal lesions.                           The colon (entire examined portion) was                            significantly redundant.                           The terminal ileum and ileocecal valve appeared                            normal.  A 12 mm polyp was found in the proximal rectum. The                            polyp was semi-sessile. The polyp was removed with                            a cold snare. Resection and retrieval were                            complete. After nearly 5 minutes of persistent                            oozing, decision was made to prevent further                            bleeding after the polypectomy, one hemostatic clip                            was successfully placed (MR conditional). There was                            no bleeding at the end of the procedure.                           Normal mucosa was found in the entire colon                            otherwise.                           Non-bleeding non-thrombosed external and internal                            hemorrhoids were found during retroflexion, during                            perianal exam and during digital exam. The                             hemorrhoids were Grade III (internal hemorrhoids                            that prolapse but require manual reduction). Complications:            No immediate complications. Estimated Blood Loss:     Estimated blood loss was minimal. Impression:               - Hemorrhoids found on digital rectal exam.                           - Redundant, looping colon.                           - The examined portion of the ileum was normal.                           -  One 12 mm polyp in the proximal rectum, removed                            with a cold snare. Resected and retrieved. Clip (MR                            conditional) was placed.                           - Normal mucosa in the entire examined colon                            otherwise.                           - Non-bleeding non-thrombosed external and internal                            hemorrhoids. Recommendation:           - The patient will be observed post-procedure,                            until all discharge criteria are met.                           - Discharge patient to home.                           - Patient has a contact number available for                            emergencies. The signs and symptoms of potential                            delayed complications were discussed with the                            patient. Return to normal activities tomorrow.                            Written discharge instructions were provided to the                            patient.                           - High fiber diet.                           - Use FiberCon 1-2 tablets PO daily.                           - Continue present medications.                           - Await pathology results.                           -  Repeat colonoscopy in 3 years for surveillance.                           - The findings and recommendations were discussed                            with the patient.                           - The  findings and recommendations were discussed                            with the patient's family. Justice Britain, MD 07/20/2022 10:27:35 AM

## 2022-07-20 NOTE — Progress Notes (Signed)
Called to room to assist during endoscopic procedure.  Patient ID and intended procedure confirmed with present staff. Received instructions for my participation in the procedure from the performing physician.  

## 2022-07-20 NOTE — Progress Notes (Signed)
GASTROENTEROLOGY PROCEDURE H&P NOTE   Primary Care Physician: Kerin Perna, NP  HPI: Cindy Hernandez is a 47 y.o. female who presents for Colonoscopy for colon cancer screening.  Past Medical History:  Diagnosis Date   Cervical dysplasia    Drug dependence ([place]) 07/17/2013   Cocaine, Marijuana use.    Dysfunctional uterine bleeding    Endometritis    Pyelonephritis    Trichomoniasis    Past Surgical History:  Procedure Laterality Date   CERVICAL BIOPSY  2023   COLONOSCOPY  07/20/2022   DILATION AND CURETTAGE OF UTERUS  05/31/2005   "to clean out infection"   WISDOM TOOTH EXTRACTION     Current Outpatient Medications  Medication Sig Dispense Refill   medroxyPROGESTERone (DEPO-PROVERA) 150 MG/ML injection Inject 150 mg into the muscle every 3 (three) months. (Patient not taking: Reported on 03/25/2022)     Current Facility-Administered Medications  Medication Dose Route Frequency Provider Last Rate Last Admin   0.9 %  sodium chloride infusion  500 mL Intravenous Once Mansouraty, Telford Nab., MD        Current Outpatient Medications:    medroxyPROGESTERone (DEPO-PROVERA) 150 MG/ML injection, Inject 150 mg into the muscle every 3 (three) months. (Patient not taking: Reported on 03/25/2022), Disp: , Rfl:   Current Facility-Administered Medications:    0.9 %  sodium chloride infusion, 500 mL, Intravenous, Once, Mansouraty, Telford Nab., MD Allergies  Allergen Reactions   Flagyl [Metronidazole] Itching and Nausea And Vomiting   Family History  Problem Relation Age of Onset   Hypertension Mother    Cancer Sister        cervical   Hypertension Brother    Breast cancer Maternal Aunt    Hypertension Maternal Grandmother    Colon cancer Neg Hx    Colon polyps Neg Hx    Esophageal cancer Neg Hx    Ulcerative colitis Neg Hx    Stomach cancer Neg Hx    Rectal cancer Neg Hx    Social History   Socioeconomic History   Marital status: Single    Spouse name: Not  on file   Number of children: Not on file   Years of education: Not on file   Highest education level: Not on file  Occupational History   Not on file  Tobacco Use   Smoking status: Every Day    Packs/day: 1.00    Years: 26.00    Total pack years: 26.00    Types: Cigarettes   Smokeless tobacco: Never   Tobacco comments:    07-20-22-last cigarette at 830  Vaping Use   Vaping Use: Never used  Substance and Sexual Activity   Alcohol use: Yes    Alcohol/week: 7.0 standard drinks of alcohol    Types: 7 Standard drinks or equivalent per week    Comment: cocaine use until pos preg test; Marijuana until 01/16/14   Drug use: Yes    Frequency: 4.0 times per week    Types: Marijuana, Cocaine    Comment: last marajuana use 07/18/22, last cocaine 07/16/22   Sexual activity: Yes    Birth control/protection: None, Injection    Comment: last unprotected intercourse 9/15.  pt desires tubal sterilization.  Other Topics Concern   Not on file  Social History Narrative   Not on file   Social Determinants of Health   Financial Resource Strain: Not on file  Food Insecurity: No Food Insecurity (06/24/2020)   Hunger Vital Sign    Worried About Running  Out of Food in the Last Year: Never true    Ran Out of Food in the Last Year: Never true  Transportation Needs: No Transportation Needs (06/24/2020)   PRAPARE - Hydrologist (Medical): No    Lack of Transportation (Non-Medical): No  Physical Activity: Not on file  Stress: Not on file  Social Connections: Not on file  Intimate Partner Violence: Not on file    Physical Exam: Today's Vitals   07/20/22 0909  BP: 126/82  Pulse: 82  Temp: 98.4 F (36.9 C)  TempSrc: Temporal  SpO2: 99%  Weight: 138 lb (62.6 kg)  Height: 5' 3"$  (1.6 m)   Body mass index is 24.45 kg/m. GEN: NAD EYE: Sclerae anicteric ENT: MMM CV: Non-tachycardic GI: Soft, NT/ND NEURO:  Alert & Oriented x 3  Lab Results: No results for  input(s): "WBC", "HGB", "HCT", "PLT" in the last 72 hours. BMET No results for input(s): "NA", "K", "CL", "CO2", "GLUCOSE", "BUN", "CREATININE", "CALCIUM" in the last 72 hours. LFT No results for input(s): "PROT", "ALBUMIN", "AST", "ALT", "ALKPHOS", "BILITOT", "BILIDIR", "IBILI" in the last 72 hours. PT/INR No results for input(s): "LABPROT", "INR" in the last 72 hours.   Impression / Plan: This is a 47 y.o.female who presents for Colonoscopy for colon cancer screening.  The risks and benefits of endoscopic evaluation/treatment were discussed with the patient and/or family; these include but are not limited to the risk of perforation, infection, bleeding, missed lesions, lack of diagnosis, severe illness requiring hospitalization, as well as anesthesia and sedation related illnesses.  The patient's history has been reviewed, patient examined, no change in status, and deemed stable for procedure.  The patient and/or family is agreeable to proceed.    Justice Britain, MD [facility] Office # [id number]

## 2022-07-20 NOTE — Patient Instructions (Signed)
Information on polyps and hemorrhoids given to you today.  Await pathology results.  Resume previous diet and medications.  Use FiberCon tablets 1-2 each day.  Repeat colonoscopy in 3 years for surveillance.   YOU HAD AN ENDOSCOPIC PROCEDURE TODAY AT [facility]:   Refer to the procedure report that was given to you for any specific questions about what was found during the examination.  If the procedure report does not answer your questions, please call your gastroenterologist to clarify.  If you requested that your care partner not be given the details of your procedure findings, then the procedure report has been included in a sealed envelope for you to review at your convenience later.  YOU SHOULD EXPECT: Some feelings of bloating in the abdomen. Passage of more gas than usual.  Walking can help get rid of the air that was put into your GI tract during the procedure and reduce the bloating. If you had a lower endoscopy (such as a colonoscopy or flexible sigmoidoscopy) you may notice spotting of blood in your stool or on the toilet paper. If you underwent a bowel prep for your procedure, you may not have a normal bowel movement for a few days.  Please Note:  You might notice some irritation and congestion in your nose or some drainage.  This is from the oxygen used during your procedure.  There is no need for concern and it should clear up in a day or so.  SYMPTOMS TO REPORT IMMEDIATELY:  Following lower endoscopy (colonoscopy or flexible sigmoidoscopy):  Excessive amounts of blood in the stool  Significant tenderness or worsening of abdominal pains  Swelling of the abdomen that is new, acute  Fever of 100F or higher   For urgent or emergent issues, a gastroenterologist can be reached at any hour by calling [phone]. Do not use MyChart messaging for urgent concerns.    DIET:  We do recommend a small meal at first, but then you may proceed to your regular diet.   Drink plenty of fluids but you should avoid alcoholic beverages for 24 hours.  ACTIVITY:  You should plan to take it easy for the rest of today and you should NOT DRIVE or use heavy machinery until tomorrow (because of the sedation medicines used during the test).    FOLLOW UP: Our staff will call the number listed on your records the next business day following your procedure.  We will call around 7:15- 8:00 am to check on you and address any questions or concerns that you may have regarding the information given to you following your procedure. If we do not reach you, we will leave a message.     If any biopsies were taken you will be contacted by phone or by letter within the next 1-3 weeks.  Please call us at [phone] if you have not heard about the biopsies in 3 weeks.    SIGNATURES/CONFIDENTIALITY: You and/or your care partner have signed paperwork which will be entered into your electronic medical record.  These signatures attest to the fact that that the information above on your After Visit Summary has been reviewed and is understood.  Full responsibility of the confidentiality of this discharge information lies with you and/or your care-partner.

## 2022-07-21 ENCOUNTER — Telehealth: Payer: Self-pay

## 2022-07-21 NOTE — Telephone Encounter (Signed)
  Follow up Call-     07/20/2022    9:09 AM 07/19/2022    3:10 PM 04/20/2022    9:39 AM  Call back number  Post procedure Call Back phone  # [phone]  Permission to leave phone message Yes Yes Yes     Patient questions:  Do you have a fever, pain , or abdominal swelling? No. Pain Score  0 *  Have you tolerated food without any problems? Yes.    Have you been able to return to your normal activities? Yes.    Do you have any questions about your discharge instructions: Diet   No. Medications  No. Follow up visit  No.  Do you have questions or concerns about your Care? No.  Actions: * If pain score is 4 or above: No action needed, pain <4.

## 2022-07-26 ENCOUNTER — Encounter: Payer: Self-pay | Admitting: Gastroenterology

## 2023-03-08 ENCOUNTER — Other Ambulatory Visit: Payer: Self-pay

## 2023-03-08 ENCOUNTER — Emergency Department (HOSPITAL_COMMUNITY): Payer: Medicaid Other

## 2023-03-08 ENCOUNTER — Encounter (HOSPITAL_COMMUNITY): Payer: Self-pay | Admitting: Emergency Medicine

## 2023-03-08 ENCOUNTER — Emergency Department (HOSPITAL_COMMUNITY)
Admission: EM | Admit: 2023-03-08 | Discharge: 2023-03-08 | Disposition: A | Discharge status: Home / Self Care | Payer: Medicaid Other | Attending: Emergency Medicine | Admitting: Emergency Medicine

## 2023-03-08 DIAGNOSIS — R519 Headache, unspecified: Secondary | ICD-10-CM | POA: Diagnosis not present

## 2023-03-08 DIAGNOSIS — R0781 Pleurodynia: Secondary | ICD-10-CM | POA: Insufficient documentation

## 2023-03-08 DIAGNOSIS — S0083XA Contusion of other part of head, initial encounter: Secondary | ICD-10-CM | POA: Insufficient documentation

## 2023-03-08 DIAGNOSIS — M419 Scoliosis, unspecified: Secondary | ICD-10-CM | POA: Diagnosis not present

## 2023-03-08 DIAGNOSIS — S299XXA Unspecified injury of thorax, initial encounter: Secondary | ICD-10-CM | POA: Diagnosis not present

## 2023-03-08 DIAGNOSIS — S0993XA Unspecified injury of face, initial encounter: Secondary | ICD-10-CM | POA: Diagnosis present

## 2023-03-08 LAB — POC URINE PREG, ED: Preg Test, Ur: NEGATIVE

## 2023-03-08 MED ORDER — OXYCODONE-ACETAMINOPHEN 5-325 MG PO TABS
1.0000 | ORAL_TABLET | Freq: Once | ORAL | Status: AC
  Administered 2023-03-08: 1 via ORAL
  Filled 2023-03-08: qty 1

## 2023-03-08 MED ORDER — OXYCODONE-ACETAMINOPHEN 5-325 MG PO TABS
1.0000 | ORAL_TABLET | Freq: Four times a day (QID) | ORAL | 0 refills | Status: AC | PRN
Start: 2023-03-08 — End: ?

## 2023-03-08 NOTE — ED Notes (Addendum)
Pts urine sent to mini lab for poc urine pregnancy. Urine pregnancy test completed by Fleet Contras with lab. Per Fleet Contras, pts urine pregnancy result is negative; however, the result has not crossed over in to the pts chart at this time. MD made aware.

## 2023-03-08 NOTE — Discharge Instructions (Signed)
You have been seen and discharged from the emergency department.  The CT of your head and face showed no acute fracture.  The x-rays of the ribs did not show any acute fracture but I suspect you could have a cracked rib.  Take pain medicine as prescribed.  Do not mix this medication with alcohol or other sedating medications. Do not drive or do heavy physical activity until you know how this medication affects you.  It may cause drowsiness.  Use spirometer as directed.  Follow-up with your primary provider for further evaluation and further care. Take home medications as prescribed. If you have any worsening symptoms or further concerns for your health please return to an emergency department for further evaluation.
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
## 2023-03-08 NOTE — ED Notes (Signed)
Pt in NAD at d/c from ED. A&O. Ambulatory. Respirations even & unlabored. Skin warm & dry. Pt provided with incentive spirometer and instructed on use, pt demonstrated proper use. Pt verbalized understanding of d/c teaching including follow up care, pain management, and reasons to return to the ED. Pt states she feels safe to be going home and family will be transporting her home. No needs or questions expressed at d/c.

## 2023-04-12 ENCOUNTER — Ambulatory Visit: Payer: Medicaid Other | Admitting: Family Medicine

## 2023-04-12 ENCOUNTER — Encounter: Payer: Self-pay | Admitting: Family Medicine

## 2023-04-12 ENCOUNTER — Other Ambulatory Visit (HOSPITAL_COMMUNITY)
Admission: RE | Admit: 2023-04-12 | Discharge: 2023-04-12 | Disposition: A | Discharge status: Home / Self Care | Payer: Medicaid Other | Source: Ambulatory Visit | Attending: Family Medicine | Admitting: Family Medicine

## 2023-04-12 ENCOUNTER — Other Ambulatory Visit: Payer: Self-pay

## 2023-04-12 VITALS — BP 139/85 | HR 69 | Wt 116.0 lb

## 2023-04-12 DIAGNOSIS — Z1231 Encounter for screening mammogram for malignant neoplasm of breast: Secondary | ICD-10-CM | POA: Diagnosis not present

## 2023-04-12 DIAGNOSIS — Z01419 Encounter for gynecological examination (general) (routine) without abnormal findings: Secondary | ICD-10-CM

## 2023-04-12 DIAGNOSIS — Z113 Encounter for screening for infections with a predominantly sexual mode of transmission: Secondary | ICD-10-CM | POA: Insufficient documentation

## 2023-04-12 DIAGNOSIS — N871 Moderate cervical dysplasia: Secondary | ICD-10-CM | POA: Diagnosis not present

## 2023-04-12 DIAGNOSIS — Z124 Encounter for screening for malignant neoplasm of cervix: Secondary | ICD-10-CM | POA: Diagnosis not present

## 2023-04-12 NOTE — Assessment & Plan Note (Signed)
Cancer screening: - Pap: collected today, see problem list for details of cervical cancer screening history - Mammogram: birads 1 09/2021, assisted with scheduling another through mychart app - Colonoscopy: had colonoscopy 07/2022, following w GI  Contraception: Declines  Mood: Reports she is stressed but declines IBH referral  Vaccinations: - Flu: declines - HPV: aged out  Metabolic: - DM and lipis: declines screening due to needle phobia  ID: - STI: wants vaginal swabs but no blood work

## 2023-04-12 NOTE — Progress Notes (Signed)
GYNECOLOGY OFFICE VISIT NOTE  History:   Cindy Hernandez is a 47 y.o. [phone] here today for annual wellness visit.  Would like pap and STI screening No other concerns  Health Maintenance Due  Topic Date Due   INFLUENZA VACCINE  Never done    Past Medical History:  Diagnosis Date   Cervical dysplasia    Drug dependence (HCC) 07/17/2013   Cocaine, Marijuana use.    Dysfunctional uterine bleeding    Endometritis    Pyelonephritis    Trichomoniasis     Past Surgical History:  Procedure Laterality Date   CERVICAL BIOPSY  2023   COLONOSCOPY  07/20/2022   DILATION AND CURETTAGE OF UTERUS  05/31/2005   "to clean out infection"   WISDOM TOOTH EXTRACTION      The following portions of the patient's history were reviewed and updated as appropriate: allergies, current medications, past family history, past medical history, past social history, past surgical history and problem list.   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  Physical Exam:  BP 139/85   Pulse 69   Wt 116 lb (52.6 kg)   LMP 04/08/2023   BMI 20.55 kg/m  CONSTITUTIONAL: Well-developed, well-nourished female in no acute distress.  HEENT:  Normocephalic, atraumatic. External right and left ear normal. No scleral icterus.  NECK: Normal range of motion, supple, no masses noted on observation SKIN: No rash noted. Not diaphoretic. No erythema. No pallor. MUSCULOSKELETAL: Normal range of motion. No edema noted. NEUROLOGIC: Alert and oriented to person, place, and time. Normal muscle tone coordination.  PSYCHIATRIC: Normal mood and affect. Normal behavior.  Normal judgment and thought content. RESPIRATORY: Effort normal, no problems with respiration noted ABDOMEN: No masses noted. No other overt distention noted.   PELVIC: Normal appearing external genitalia; normal appearing vaginal mucosa and cervix.  No abnormal discharge noted.    Labs and Imaging No results found for this or any previous visit (from the past 168 hour(s)). No results found.    Assessment and Plan:   Problem List Items Addressed This Visit       Genitourinary   Dysplasia of cervix, high grade CIN 2     Other   Well woman exam    Cancer screening: - Pap: collected today, see problem list for details of cervical cancer screening history - Mammogram: birads 1 09/2021, assisted with scheduling another through mychart app - Colonoscopy: had colonoscopy 07/2022, following w GI  Contraception: Declines  Mood: Reports she is stressed but declines IBH referral  Vaccinations: - Flu: declines - HPV: aged out  Metabolic: - DM and lipis: declines screening due to needle phobia  ID: - STI: wants vaginal swabs but no blood work      Other Visit Diagnoses     Breast cancer screening by mammogram    -  Primary   Relevant Orders   MM 3D SCREENING MAMMOGRAM BILATERAL BREAST   Screening for cervical cancer       Relevant Orders   Cytology - PAP( [place])   Screening examination for sexually transmitted disease  Relevant Orders   Cervicovaginal ancillary only( )       Routine preventative health maintenance measures emphasized. Please refer to After Visit Summary for other counseling recommendations.   Return in 1 year (on 04/11/2024) for Annual Wellness Visit.    Total face-to-face time with patient: 20 minutes.  Over 50% of encounter was spent on counseling and coordination of care.   Venora Maples, MD/MPH Attending Family Medicine Physician, [facility] for [facility], [facility]

## 2023-04-13 LAB — CERVICOVAGINAL ANCILLARY ONLY
Bacterial Vaginitis (gardnerella): POSITIVE — AB
Candida Glabrata: NEGATIVE
Candida Vaginitis: NEGATIVE
Chlamydia: NEGATIVE
Comment: NEGATIVE
Comment: NEGATIVE
Comment: NEGATIVE
Comment: NEGATIVE
Comment: NEGATIVE
Comment: NORMAL
Neisseria Gonorrhea: NEGATIVE
Trichomonas: NEGATIVE

## 2023-04-13 MED ORDER — CLINDESSE 2 % VA CREA: 1.0000 | TOPICAL_CREAM | Freq: Once | VAGINAL | 0 refills | Status: AC

## 2023-04-13 NOTE — Addendum Note (Signed)
Addended by: Merian Capron on: 04/13/2023 02:26 PM   Modules accepted: Orders

## 2023-04-14 LAB — CYTOLOGY - PAP
Comment: NEGATIVE
Diagnosis: NEGATIVE
High risk HPV: NEGATIVE

## 2023-04-14 NOTE — Progress Notes (Signed)
Reassuring result, but unfortunately she does not fall into any clear testing pathway. Given history will do yearly testing x3 and if all normal can space out. Repeat pap in 03/2024.

## 2023-05-03 ENCOUNTER — Other Ambulatory Visit: Payer: Self-pay | Admitting: Family Medicine

## 2023-05-03 DIAGNOSIS — Z1231 Encounter for screening mammogram for malignant neoplasm of breast: Secondary | ICD-10-CM

## 2023-05-12 DIAGNOSIS — Z1231 Encounter for screening mammogram for malignant neoplasm of breast: Secondary | ICD-10-CM

## 2023-05-16 ENCOUNTER — Encounter: Payer: Self-pay | Admitting: Family Medicine

## 2023-06-23 ENCOUNTER — Ambulatory Visit: Payer: Medicaid Other | Admitting: Family Medicine

## 2023-07-07 ENCOUNTER — Ambulatory Visit: Payer: Medicaid Other

## 2023-07-12 ENCOUNTER — Other Ambulatory Visit: Payer: Self-pay

## 2023-07-12 ENCOUNTER — Ambulatory Visit: Payer: Medicaid Other

## 2023-07-12 ENCOUNTER — Other Ambulatory Visit (HOSPITAL_COMMUNITY)
Admission: RE | Admit: 2023-07-12 | Discharge: 2023-07-12 | Disposition: A | Discharge status: Home / Self Care | Payer: Medicaid Other | Source: Ambulatory Visit | Attending: Family Medicine | Admitting: Family Medicine

## 2023-07-12 VITALS — BP 135/75 | HR 62 | Ht 63.0 in | Wt 112.3 lb

## 2023-07-12 DIAGNOSIS — N898 Other specified noninflammatory disorders of vagina: Secondary | ICD-10-CM | POA: Diagnosis present

## 2023-07-12 NOTE — Progress Notes (Signed)
Cindy Hernandez is here with concern of BV with symptoms of white vaginal discharge, some odor. These symptoms have been present for approximately three weeks. Patient has had unprotected intercourse and requested to add STI on vaginal swab.  Self swab instructions given and specimen obtained. Explained patient will be contacted with any abnormal results. Next annual due on 03/2024.   [name], RN 07/12/2023  3:18 PM

## 2023-07-13 LAB — CERVICOVAGINAL ANCILLARY ONLY
Bacterial Vaginitis (gardnerella): POSITIVE — AB
Candida Glabrata: NEGATIVE
Candida Vaginitis: NEGATIVE
Chlamydia: NEGATIVE
Comment: NEGATIVE
Comment: NEGATIVE
Comment: NEGATIVE
Comment: NEGATIVE
Comment: NEGATIVE
Comment: NORMAL
Neisseria Gonorrhea: NEGATIVE
Trichomonas: NEGATIVE

## 2023-07-14 ENCOUNTER — Other Ambulatory Visit: Payer: Self-pay | Admitting: Obstetrics and Gynecology

## 2023-07-14 MED ORDER — CLINDAMYCIN PHOSPHATE 2 % VA CREA
1.0000 | TOPICAL_CREAM | Freq: Every day | VAGINAL | 0 refills | Status: AC
Start: 2023-07-14 — End: 2023-07-21

## 2023-07-19 ENCOUNTER — Other Ambulatory Visit: Payer: Self-pay | Admitting: Obstetrics and Gynecology

## 2023-07-21 MED ORDER — CLINDAMYCIN HCL 300 MG PO CAPS: 300.0000 mg | ORAL_CAPSULE | Freq: Two times a day (BID) | ORAL | 0 refills | Status: AC

## 2023-07-28 ENCOUNTER — Ambulatory Visit: Payer: Medicaid Other | Admitting: Certified Nurse Midwife

## 2023-10-31 ENCOUNTER — Other Ambulatory Visit: Payer: Self-pay | Admitting: Family Medicine

## 2023-10-31 DIAGNOSIS — Z1231 Encounter for screening mammogram for malignant neoplasm of breast: Secondary | ICD-10-CM

## 2023-11-04 ENCOUNTER — Encounter

## 2023-11-04 DIAGNOSIS — Z1231 Encounter for screening mammogram for malignant neoplasm of breast: Secondary | ICD-10-CM

## 2023-11-07 ENCOUNTER — Encounter

## 2023-12-27 ENCOUNTER — Ambulatory Visit: Admitting: Obstetrics and Gynecology

## 2024-01-10 ENCOUNTER — Ambulatory Visit

## 2024-01-10 ENCOUNTER — Other Ambulatory Visit (HOSPITAL_COMMUNITY)
Admission: RE | Admit: 2024-01-10 | Discharge: 2024-01-10 | Disposition: A | Discharge status: Home / Self Care | Source: Ambulatory Visit | Attending: Family Medicine | Admitting: Family Medicine

## 2024-01-10 ENCOUNTER — Other Ambulatory Visit: Payer: Self-pay

## 2024-01-10 VITALS — BP 128/88 | HR 81 | Wt 103.1 lb

## 2024-01-10 DIAGNOSIS — Z113 Encounter for screening for infections with a predominantly sexual mode of transmission: Secondary | ICD-10-CM

## 2024-01-10 DIAGNOSIS — N898 Other specified noninflammatory disorders of vagina: Secondary | ICD-10-CM

## 2024-01-10 NOTE — Progress Notes (Signed)
 Pt in office today for nurse visit.  She requests to complete a self vaginal swab due to having persistent white discharge and occasional odor.  Self swab completed by patient.  Informed pt that office staff will contact her for any abnormal results and treatment if needed.  Pt verbalized understanding with no further questions at this time.    [place], RN

## 2024-01-11 LAB — CERVICOVAGINAL ANCILLARY ONLY
Bacterial Vaginitis (gardnerella): POSITIVE — AB
Candida Glabrata: NEGATIVE
Candida Vaginitis: NEGATIVE
Chlamydia: POSITIVE — AB
Comment: NEGATIVE
Comment: NEGATIVE
Comment: NEGATIVE
Comment: NEGATIVE
Comment: NEGATIVE
Comment: NORMAL
Neisseria Gonorrhea: NEGATIVE
Trichomonas: NEGATIVE

## 2024-01-12 ENCOUNTER — Ambulatory Visit: Payer: Self-pay | Admitting: Obstetrics and Gynecology

## 2024-01-12 DIAGNOSIS — B9689 Other specified bacterial agents as the cause of diseases classified elsewhere: Secondary | ICD-10-CM

## 2024-01-12 MED ORDER — CLINDAMYCIN PHOSPHATE 2 % VA CREA
1.0000 | TOPICAL_CREAM | Freq: Every day | VAGINAL | 0 refills | Status: DC
Start: 2024-01-12 — End: 2024-01-12

## 2024-01-12 MED ORDER — TINIDAZOLE 500 MG PO TABS: 2.0000 g | ORAL_TABLET | Freq: Every day | ORAL | 0 refills | Status: AC

## 2024-01-12 MED ORDER — DOXYCYCLINE HYCLATE 100 MG PO CAPS
100.0000 mg | ORAL_CAPSULE | Freq: Two times a day (BID) | ORAL | 0 refills | Status: AC
Start: 2024-01-12 — End: 2024-01-19

## 2024-01-24 ENCOUNTER — Telehealth: Payer: Self-pay | Admitting: Lactation Services

## 2024-01-24 NOTE — Telephone Encounter (Signed)
 Cindy Hernandez (Key: [place] PA Case ID #: [id number] Rx #: [id number] Need Help? Call us  at [phone] Outcome Denied on August 21 by [employer] 2017 NCPDP Request Reference Number: [id number]. TINIDAZOLE  TAB 500MG  is denied for not meeting the prior authorization requirement(s). For further questions, call Community & State at [phone] for more information. Drug Tinidazole  500MG  tablets ePA cloud logo Form [employer] Electronic Prior Authorization Form [id number] NCPDP) Original Claim Info 70 PA Req, Dr submit ePA at [email or web address] MD Call [phone], Possible 3 [id number] 03 Lvl of Service PA# 72 PA TYPE8  Sent Mychart message to patient to inform her has been denied and she can either pay out of pocket or can try another medication.

## 2024-03-23 ENCOUNTER — Ambulatory Visit: Admitting: Obstetrics and Gynecology

## 2024-04-25 ENCOUNTER — Other Ambulatory Visit (HOSPITAL_COMMUNITY)
Admission: RE | Admit: 2024-04-25 | Discharge: 2024-04-25 | Disposition: A | Discharge status: Home / Self Care | Source: Ambulatory Visit | Attending: Family Medicine | Admitting: Family Medicine

## 2024-04-25 ENCOUNTER — Other Ambulatory Visit: Payer: Self-pay

## 2024-04-25 ENCOUNTER — Ambulatory Visit (INDEPENDENT_AMBULATORY_CARE_PROVIDER_SITE_OTHER)

## 2024-04-25 VITALS — BP 99/65 | HR 84 | Ht 63.0 in | Wt 102.0 lb

## 2024-04-25 DIAGNOSIS — N898 Other specified noninflammatory disorders of vagina: Secondary | ICD-10-CM | POA: Diagnosis present

## 2024-04-25 NOTE — Progress Notes (Signed)
 Cindy Hernandez is here with concern of white yeast-like discharge. These symptoms have been present for 3-4 weeks. Patient would also like a TOC for Chlamydia.   Self swab instructions given and specimen obtained. Explained patient will be contacted with any abnormal results. Patient is due for annual. An appointment has been made for  06/06/24. Patient confirmed scheduled appointment.   Rosaline Pendleton, RN 04/25/2024  2:21 PM

## 2024-04-27 LAB — CERVICOVAGINAL ANCILLARY ONLY
Bacterial Vaginitis (gardnerella): POSITIVE — AB
Candida Glabrata: NEGATIVE
Candida Vaginitis: NEGATIVE
Chlamydia: NEGATIVE
Comment: NEGATIVE
Comment: NEGATIVE
Comment: NEGATIVE
Comment: NEGATIVE
Comment: NEGATIVE
Comment: NORMAL
Neisseria Gonorrhea: NEGATIVE
Trichomonas: NEGATIVE

## 2024-04-30 ENCOUNTER — Ambulatory Visit: Payer: Self-pay | Admitting: Student

## 2024-04-30 DIAGNOSIS — N898 Other specified noninflammatory disorders of vagina: Secondary | ICD-10-CM

## 2024-04-30 DIAGNOSIS — B9689 Other specified bacterial agents as the cause of diseases classified elsewhere: Secondary | ICD-10-CM

## 2024-04-30 MED ORDER — CLINDAMYCIN HCL 300 MG PO CAPS: 300.0000 mg | ORAL_CAPSULE | Freq: Two times a day (BID) | ORAL | 0 refills | Status: AC

## 2024-05-02 ENCOUNTER — Telehealth: Payer: Self-pay | Admitting: Family Medicine

## 2024-05-02 NOTE — Telephone Encounter (Signed)
 Called pt and informed her of test results and that treatment has been sent to her pharmacy. She voiced understanding.

## 2024-05-02 NOTE — Telephone Encounter (Signed)
 Patient is requesting a call back for results of her self swab.

## 2024-06-06 ENCOUNTER — Ambulatory Visit: Admitting: Family Medicine

## 2024-06-11 ENCOUNTER — Emergency Department (HOSPITAL_COMMUNITY)

## 2024-06-11 ENCOUNTER — Other Ambulatory Visit: Payer: Self-pay

## 2024-06-11 ENCOUNTER — Encounter (HOSPITAL_COMMUNITY): Payer: Self-pay

## 2024-06-11 ENCOUNTER — Emergency Department (HOSPITAL_COMMUNITY)
Admission: EM | Admit: 2024-06-11 | Discharge: 2024-06-12 | Attending: Emergency Medicine | Admitting: Emergency Medicine

## 2024-06-11 DIAGNOSIS — J069 Acute upper respiratory infection, unspecified: Secondary | ICD-10-CM | POA: Diagnosis not present

## 2024-06-11 DIAGNOSIS — Z5321 Procedure and treatment not carried out due to patient leaving prior to being seen by health care provider: Secondary | ICD-10-CM | POA: Insufficient documentation

## 2024-06-11 DIAGNOSIS — R059 Cough, unspecified: Secondary | ICD-10-CM | POA: Diagnosis present

## 2024-06-11 LAB — COMPREHENSIVE METABOLIC PANEL WITH GFR
ALT: 9 U/L (ref 0–44)
AST: 13 U/L — ABNORMAL LOW (ref 15–41)
Albumin: 3.8 g/dL (ref 3.5–5.0)
Alkaline Phosphatase: 80 U/L (ref 38–126)
Anion gap: 14 (ref 5–15)
BUN: 7 mg/dL (ref 6–20)
CO2: 20 mmol/L — ABNORMAL LOW (ref 22–32)
Calcium: 8.9 mg/dL (ref 8.9–10.3)
Chloride: 103 mmol/L (ref 98–111)
Creatinine, Ser: 0.7 mg/dL (ref 0.44–1.00)
GFR, Estimated: >60 mL/min
Glucose, Bld: 91 mg/dL (ref 70–99)
Potassium: 3.5 mmol/L (ref 3.5–5.1)
Sodium: 136 mmol/L (ref 135–145)
Total Bilirubin: 0.7 mg/dL (ref 0.0–1.2)
Total Protein: 7.3 g/dL (ref 6.5–8.1)

## 2024-06-11 LAB — CBC WITH DIFFERENTIAL/PLATELET
Abs Immature Granulocytes: 0.04 K/uL (ref 0.00–0.07)
Basophils Absolute: 0 K/uL (ref 0.0–0.1)
Basophils Relative: 0 %
Eosinophils Absolute: 0 K/uL (ref 0.0–0.5)
Eosinophils Relative: 0 %
HCT: 35.7 % — ABNORMAL LOW (ref 36.0–46.0)
Hemoglobin: 12.1 g/dL (ref 12.0–15.0)
Immature Granulocytes: 0 %
Lymphocytes Relative: 14 %
Lymphs Abs: 1.5 K/uL (ref 0.7–4.0)
MCH: 34.9 pg — ABNORMAL HIGH (ref 26.0–34.0)
MCHC: 33.9 g/dL (ref 30.0–36.0)
MCV: 102.9 fL — ABNORMAL HIGH (ref 80.0–100.0)
Monocytes Absolute: 1.1 K/uL — ABNORMAL HIGH (ref 0.1–1.0)
Monocytes Relative: 10 %
Neutro Abs: 8.3 K/uL — ABNORMAL HIGH (ref 1.7–7.7)
Neutrophils Relative %: 76 %
Platelets: 226 K/uL (ref 150–400)
RBC: 3.47 MIL/uL — ABNORMAL LOW (ref 3.87–5.11)
RDW: 12.3 % (ref 11.5–15.5)
WBC: 11 K/uL — ABNORMAL HIGH (ref 4.0–10.5)
nRBC: 0 % (ref 0.0–0.2)

## 2024-06-11 LAB — RESP PANEL BY RT-PCR (RSV, FLU A&B, COVID)  RVPGX2
Influenza A by PCR: NEGATIVE
Influenza B by PCR: NEGATIVE
Resp Syncytial Virus by PCR: NEGATIVE
SARS Coronavirus 2 by RT PCR: NEGATIVE

## 2024-06-11 LAB — HCG, SERUM, QUALITATIVE: Preg, Serum: NEGATIVE

## 2024-06-11 LAB — CK: Total CK: 41 U/L (ref 38–234)

## 2024-06-11 NOTE — ED Notes (Signed)
Verbal consent for MSE.  ?

## 2024-06-11 NOTE — ED Provider Triage Note (Signed)
 Emergency Medicine Provider Triage Evaluation Note  Cindy Hernandez , a 49 y.o. female  was evaluated in triage.  Pt complains of FLS for the past 2 days. Reports diffuse body aches, congestion, and runny nose. No recent sick contacts. OTC medication not helping.  Review of Systems  Positive:  Negative:   Physical Exam  BP 120/75 (BP Location: Left Arm)   Pulse (!) 119   Temp 98.5 F (36.9 C) (Oral)   Resp 16   Ht 5' 3 (1.6 m)   Wt 49.9 kg   LMP 05/01/2024 (Approximate)   SpO2 98%   BMI 19.49 kg/m  Gen:   Awake, no distress   Resp:  Normal effort  MSK:   Moves extremities without difficulty  Other:    Medical Decision Making  Medically screening exam initiated at 8:18 PM.  Appropriate orders placed.  Julianah D Rozeboom was informed that the remainder of the evaluation will be completed by another provider, this initial triage assessment does not replace that evaluation, and the importance of remaining in the ED until their evaluation is complete.  Labs ordered.    Bernis Ernst, PA-C 06/11/24 2019

## 2024-06-11 NOTE — ED Triage Notes (Signed)
 Patient states she has nasal congestion, cough, body aches, fever, chills. She states she feels like she has the flu. Symptoms started 2 days. Theraflu is not helping.

## 2024-06-11 NOTE — ED Notes (Signed)
Pt cannot urinate at this time

## 2024-06-12 NOTE — ED Notes (Signed)
No answer for room x3
